# Patient Record
Sex: Female | Born: 1975 | Race: Black or African American | Hispanic: No | Marital: Married | Smoking: Current every day smoker
Health system: Southern US, Community
[De-identification: ages and names within clinical notes are randomized; demographics above are authoritative.]

## PROBLEM LIST (undated history)

## (undated) DIAGNOSIS — IMO0002 Reserved for concepts with insufficient information to code with codable children: Secondary | ICD-10-CM

## (undated) DIAGNOSIS — I10 Essential (primary) hypertension: Secondary | ICD-10-CM

## (undated) DIAGNOSIS — M1990 Unspecified osteoarthritis, unspecified site: Secondary | ICD-10-CM

## (undated) HISTORY — PX: OTHER SURGICAL HISTORY: SHX169

## (undated) HISTORY — DX: Reserved for concepts with insufficient information to code with codable children: IMO0002

---

## 2003-03-27 ENCOUNTER — Emergency Department (HOSPITAL_COMMUNITY): Admission: EM | Admit: 2003-03-27 | Discharge: 2003-03-27 | Payer: Self-pay | Admitting: Emergency Medicine

## 2008-01-20 ENCOUNTER — Emergency Department (HOSPITAL_COMMUNITY): Admission: EM | Admit: 2008-01-20 | Discharge: 2008-01-20 | Payer: Self-pay | Admitting: Emergency Medicine

## 2008-12-24 ENCOUNTER — Emergency Department (HOSPITAL_COMMUNITY): Admission: EM | Admit: 2008-12-24 | Discharge: 2008-12-24 | Payer: Self-pay | Admitting: Emergency Medicine

## 2009-03-25 ENCOUNTER — Other Ambulatory Visit: Admission: RE | Admit: 2009-03-25 | Discharge: 2009-03-25 | Payer: Self-pay | Admitting: Unknown Physician Specialty

## 2009-03-25 ENCOUNTER — Other Ambulatory Visit
Admission: RE | Admit: 2009-03-25 | Discharge: 2009-03-25 | Payer: Self-pay | Source: Home / Self Care | Admitting: Unknown Physician Specialty

## 2010-08-05 LAB — HEPATITIS B SURFACE ANTIGEN: Hepatitis B Surface Ag: NEGATIVE

## 2010-08-05 LAB — ABO/RH: RH Type: POSITIVE

## 2010-08-05 LAB — HIV ANTIBODY (ROUTINE TESTING W REFLEX): HIV: NONREACTIVE

## 2010-08-05 LAB — RPR: RPR: NONREACTIVE

## 2010-08-31 ENCOUNTER — Other Ambulatory Visit (HOSPITAL_COMMUNITY)
Admission: RE | Admit: 2010-08-31 | Discharge: 2010-08-31 | Disposition: A | Discharge status: Home / Self Care | Payer: PRIVATE HEALTH INSURANCE | Source: Ambulatory Visit | Attending: Obstetrics and Gynecology | Admitting: Obstetrics and Gynecology

## 2010-08-31 ENCOUNTER — Other Ambulatory Visit: Payer: Self-pay | Admitting: Obstetrics and Gynecology

## 2010-08-31 DIAGNOSIS — Z01419 Encounter for gynecological examination (general) (routine) without abnormal findings: Secondary | ICD-10-CM | POA: Insufficient documentation

## 2010-11-24 LAB — STREP A DNA PROBE

## 2010-11-24 LAB — RAPID STREP SCREEN (MED CTR MEBANE ONLY): Streptococcus, Group A Screen (Direct): NEGATIVE

## 2011-01-21 ENCOUNTER — Encounter (HOSPITAL_COMMUNITY): Payer: Self-pay | Admitting: Pharmacist

## 2011-01-27 ENCOUNTER — Encounter (HOSPITAL_COMMUNITY): Payer: Self-pay

## 2011-01-28 ENCOUNTER — Encounter (HOSPITAL_COMMUNITY)
Admission: RE | Admit: 2011-01-28 | Discharge: 2011-01-28 | Disposition: A | Discharge status: Home / Self Care | Payer: No Typology Code available for payment source | Source: Ambulatory Visit | Attending: Obstetrics and Gynecology | Admitting: Obstetrics and Gynecology

## 2011-01-28 ENCOUNTER — Other Ambulatory Visit (HOSPITAL_COMMUNITY): Payer: Medicaid Other

## 2011-01-28 ENCOUNTER — Other Ambulatory Visit: Payer: Self-pay | Admitting: Obstetrics and Gynecology

## 2011-01-28 ENCOUNTER — Encounter (HOSPITAL_COMMUNITY): Payer: Self-pay

## 2011-01-28 ENCOUNTER — Encounter: Payer: Self-pay | Admitting: Obstetrics and Gynecology

## 2011-01-28 HISTORY — DX: Unspecified osteoarthritis, unspecified site: M19.90

## 2011-01-28 LAB — SURGICAL PCR SCREEN
MRSA, PCR: NEGATIVE
Staphylococcus aureus: NEGATIVE

## 2011-01-28 LAB — CBC
HCT: 35.4 % — ABNORMAL LOW (ref 36.0–46.0)
Hemoglobin: 11.8 g/dL — ABNORMAL LOW (ref 12.0–15.0)
MCV: 87.2 fL (ref 78.0–100.0)
RDW: 14.6 % (ref 11.5–15.5)
WBC: 9.1 10*3/uL (ref 4.0–10.5)

## 2011-01-28 NOTE — H&P (Signed)
  Gloria Fletcher is a 35 y.o. female presenting for repeat cesarean section and tubal ligation and 39 weeks 2 days gestation after pregnancy course followed at [facility] since 08/05/2010, [redacted] weeks gestation. Prenatal course has been notable for weight gain appropriate fundal height growth with prenatal labs included blood type A positive antibody screen negative, rubella immunity present hemoglobin 13 hematocrit 38, platelets 314,000 with test negative for hepatitis HIV syphilis gonorrhea and chlamydia. GROUP B strep is positive as on 01/21/2011 with 2 hour glucose tolerance test normal. 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  She desires permanent sterilization by tubal ligation at time of cesarean section and understands the failure rate of 1% History OB History    Grav Para Term Preterm Abortions TAB SAB Ect Mult Living   2 1 1       1     Past Medical History  Diagnosis Date  . Arthritis     generalized  . Abnormal Pap smear    Past Surgical History  Procedure Date  . Cesarean section 2002   Family History: family history is not on file. Social History:  reports that she has been smoking Cigarettes.  She has a 4.5 pack-year smoking history. She has never used smokeless tobacco. She reports that she does not drink alcohol or use illicit drugs.  ROS she denies difficulty with prior spinal for cesarean section    Last menstrual period 05/24/2010. Exam Physical Exam Physical Examination: General appearance - alert, well appearing, and in no distress, oriented to person, place, and time and overweight Mouth - dental hygiene good and Good oral airway Chest - clear to auscultation, no wheezes, rales or rhonchi, symmetric air entry Heart - normal rate and regular rhythm Abdomen - gravid uterus 39 cm fundal height fetal heart rate tones 44 beats per minute vertex presentation confirmed. prior well-healed  cesarean section scar without hernia Pelvic -  deferred at this time Extremities - peripheral pulses normal, no pedal edema, no clubbing or cyanosis, pedal edema 1+ + Prenatal labs: ABO, Rh: A/Positive/-- (06/13 0000) Antibody: Negative (06/13 0000) Rubella: Immune (06/13 0000) RPR: Nonreactive (06/13 0000)  HBsAg: Negative (06/13 0000)  HIV: Non-reactive (06/13 0000)  GBS:    positive  Tubal ligation papers signed 11/11/2010  Assessment/Plan: Gloria Fletcher is a 39 weeks 2 days gestation patient scheduled for repeat cesarean section and tubal ligation 9 AM 02/01/2011   Gloria Fletcher V 01/28/2011, 10:10 PM     

## 2011-01-28 NOTE — Patient Instructions (Addendum)
   Your procedure is scheduled on: Monday December 10th  Enter through the Main Entrance of [facility] at: 7:30am Pick up the phone at the desk and dial [phone] and inform us of your arrival.  Please call this number if you have any problems the morning of surgery: [phone]  Remember: Do not eat food after midnight: Sunday Do not drink clear liquids after:midnight Sunday Take these medicines the morning of surgery with a SIP OF WATER: none  Do not wear jewelry, make-up, or FINGER nail polish Do not wear lotions, powders, perfumes or deodorant. Do not shave 48 hours prior to surgery. Do not bring valuables to the hospital.  Leave suitcase in the car. After Surgery it may be brought to your room. For patients being admitted to the hospital, checkout time is 11:00am the day of discharge.     Remember to use your hibiclens as instructed.Please shower with 1/2 bottle the evening before your surgery and the other 1/2 bottle the morning of surgery.

## 2011-01-29 ENCOUNTER — Other Ambulatory Visit (HOSPITAL_COMMUNITY): Payer: Medicaid Other

## 2011-02-01 ENCOUNTER — Encounter (HOSPITAL_COMMUNITY): Payer: Self-pay | Admitting: Anesthesiology

## 2011-02-01 ENCOUNTER — Inpatient Hospital Stay (HOSPITAL_COMMUNITY)
Admission: RE | Admit: 2011-02-01 | Discharge: 2011-02-03 | DRG: 766 | Disposition: A | Discharge status: Home / Self Care | Payer: No Typology Code available for payment source | Source: Ambulatory Visit | Attending: Obstetrics and Gynecology | Admitting: Obstetrics and Gynecology

## 2011-02-01 ENCOUNTER — Inpatient Hospital Stay (HOSPITAL_COMMUNITY): Payer: No Typology Code available for payment source | Admitting: Anesthesiology

## 2011-02-01 ENCOUNTER — Encounter (HOSPITAL_COMMUNITY): Payer: Self-pay | Admitting: *Deleted

## 2011-02-01 ENCOUNTER — Encounter (HOSPITAL_COMMUNITY)
Admission: RE | Disposition: A | Discharge status: Home / Self Care | Payer: Self-pay | Source: Ambulatory Visit | Attending: Obstetrics and Gynecology

## 2011-02-01 DIAGNOSIS — O99892 Other specified diseases and conditions complicating childbirth: Secondary | ICD-10-CM | POA: Diagnosis present

## 2011-02-01 DIAGNOSIS — O34219 Maternal care for unspecified type scar from previous cesarean delivery: Principal | ICD-10-CM | POA: Diagnosis present

## 2011-02-01 DIAGNOSIS — Z01812 Encounter for preprocedural laboratory examination: Secondary | ICD-10-CM

## 2011-02-01 DIAGNOSIS — Z01818 Encounter for other preprocedural examination: Secondary | ICD-10-CM

## 2011-02-01 DIAGNOSIS — Z302 Encounter for sterilization: Secondary | ICD-10-CM

## 2011-02-01 DIAGNOSIS — Z22330 Carrier of Group B streptococcus: Secondary | ICD-10-CM

## 2011-02-01 SURGERY — Surgical Case
Anesthesia: Spinal | Wound class: Clean Contaminated

## 2011-02-01 MED ORDER — MORPHINE SULFATE 0.5 MG/ML IJ SOLN
INTRAMUSCULAR | Status: AC
  Filled 2011-02-01: qty 10

## 2011-02-01 MED ORDER — OXYTOCIN 10 UNIT/ML IJ SOLN
INTRAMUSCULAR | Status: AC
  Filled 2011-02-01: qty 2

## 2011-02-01 MED ORDER — FENTANYL CITRATE 0.05 MG/ML IJ SOLN
INTRAMUSCULAR | Status: DC | PRN
  Administered 2011-02-01: 25 ug via INTRATHECAL

## 2011-02-01 MED ORDER — KETOROLAC TROMETHAMINE 30 MG/ML IJ SOLN: 30.0000 mg | Freq: Four times a day (QID) | INTRAMUSCULAR | Status: AC | PRN

## 2011-02-01 MED ORDER — SIMETHICONE 80 MG PO CHEW: 80.0000 mg | CHEWABLE_TABLET | ORAL | Status: DC | PRN

## 2011-02-01 MED ORDER — ONDANSETRON HCL 4 MG PO TABS: 4.0000 mg | ORAL_TABLET | ORAL | Status: DC | PRN

## 2011-02-01 MED ORDER — IBUPROFEN 600 MG PO TABS
600.0000 mg | ORAL_TABLET | Freq: Four times a day (QID) | ORAL | Status: DC
  Administered 2011-02-01 – 2011-02-03 (×7): 600 mg via ORAL
  Filled 2011-02-01 (×4): qty 1

## 2011-02-01 MED ORDER — OXYCODONE-ACETAMINOPHEN 5-325 MG PO TABS
1.0000 | ORAL_TABLET | ORAL | Status: DC | PRN
  Administered 2011-02-01 – 2011-02-03 (×4): 1 via ORAL
  Administered 2011-02-03: 2 via ORAL
  Filled 2011-02-01 (×3): qty 1
  Filled 2011-02-01: qty 2
  Filled 2011-02-01: qty 1

## 2011-02-01 MED ORDER — DIPHENHYDRAMINE HCL 50 MG/ML IJ SOLN: 12.5000 mg | INTRAMUSCULAR | Status: DC | PRN

## 2011-02-01 MED ORDER — KETOROLAC TROMETHAMINE 30 MG/ML IJ SOLN
30.0000 mg | Freq: Four times a day (QID) | INTRAMUSCULAR | Status: AC | PRN
  Administered 2011-02-01: 30 mg via INTRAVENOUS

## 2011-02-01 MED ORDER — NALBUPHINE HCL 10 MG/ML IJ SOLN
5.0000 mg | INTRAMUSCULAR | Status: DC | PRN
  Filled 2011-02-01: qty 1

## 2011-02-01 MED ORDER — METOCLOPRAMIDE HCL 5 MG/ML IJ SOLN: 10.0000 mg | Freq: Once | INTRAMUSCULAR | Status: DC | PRN

## 2011-02-01 MED ORDER — EPHEDRINE SULFATE 50 MG/ML IJ SOLN
INTRAMUSCULAR | Status: DC | PRN
  Administered 2011-02-01 (×4): 10 mg via INTRAVENOUS

## 2011-02-01 MED ORDER — MENTHOL 3 MG MT LOZG: 1.0000 | LOZENGE | OROMUCOSAL | Status: DC | PRN

## 2011-02-01 MED ORDER — SENNOSIDES-DOCUSATE SODIUM 8.6-50 MG PO TABS
2.0000 | ORAL_TABLET | Freq: Every day | ORAL | Status: DC
  Administered 2011-02-01: 2 via ORAL

## 2011-02-01 MED ORDER — NALOXONE HCL 0.4 MG/ML IJ SOLN: 0.4000 mg | INTRAMUSCULAR | Status: DC | PRN

## 2011-02-01 MED ORDER — ONDANSETRON HCL 4 MG/2ML IJ SOLN: 4.0000 mg | INTRAMUSCULAR | Status: DC | PRN

## 2011-02-01 MED ORDER — TETANUS-DIPHTH-ACELL PERTUSSIS 5-2.5-18.5 LF-MCG/0.5 IM SUSP
0.5000 mL | Freq: Once | INTRAMUSCULAR | Status: AC
  Administered 2011-02-02: 0.5 mL via INTRAMUSCULAR
  Filled 2011-02-01: qty 0.5

## 2011-02-01 MED ORDER — CEFAZOLIN SODIUM-DEXTROSE 2-3 GM-% IV SOLR
2.0000 g | INTRAVENOUS | Status: AC
  Administered 2011-02-01: 2 g via INTRAVENOUS
  Filled 2011-02-01: qty 50

## 2011-02-01 MED ORDER — METOCLOPRAMIDE HCL 5 MG/ML IJ SOLN: 10.0000 mg | Freq: Three times a day (TID) | INTRAMUSCULAR | Status: DC | PRN

## 2011-02-01 MED ORDER — SCOPOLAMINE 1 MG/3DAYS TD PT72
MEDICATED_PATCH | TRANSDERMAL | Status: AC
  Administered 2011-02-01: 1.5 mg via TRANSDERMAL
  Filled 2011-02-01: qty 1

## 2011-02-01 MED ORDER — PHENYLEPHRINE HCL 10 MG/ML IJ SOLN
INTRAMUSCULAR | Status: DC | PRN
  Administered 2011-02-01: 80 ug via INTRAVENOUS

## 2011-02-01 MED ORDER — WITCH HAZEL-GLYCERIN EX PADS: 1.0000 "application " | MEDICATED_PAD | CUTANEOUS | Status: DC | PRN

## 2011-02-01 MED ORDER — FENTANYL CITRATE 0.05 MG/ML IJ SOLN
INTRAMUSCULAR | Status: AC
  Filled 2011-02-01: qty 2

## 2011-02-01 MED ORDER — OXYTOCIN 20 UNITS IN LACTATED RINGERS INFUSION - SIMPLE
125.0000 mL/h | INTRAVENOUS | Status: AC
  Administered 2011-02-01: 125 mL/h via INTRAVENOUS

## 2011-02-01 MED ORDER — SODIUM CHLORIDE 0.9 % IV SOLN: 1.0000 ug/kg/h | INTRAVENOUS | Status: DC | PRN

## 2011-02-01 MED ORDER — SIMETHICONE 80 MG PO CHEW
80.0000 mg | CHEWABLE_TABLET | Freq: Three times a day (TID) | ORAL | Status: DC
  Administered 2011-02-01 – 2011-02-03 (×6): 80 mg via ORAL

## 2011-02-01 MED ORDER — EPHEDRINE 5 MG/ML INJ
INTRAVENOUS | Status: AC
  Filled 2011-02-01: qty 10

## 2011-02-01 MED ORDER — DIPHENHYDRAMINE HCL 25 MG PO CAPS: 25.0000 mg | ORAL_CAPSULE | ORAL | Status: DC | PRN

## 2011-02-01 MED ORDER — ONDANSETRON HCL 4 MG/2ML IJ SOLN: 4.0000 mg | Freq: Three times a day (TID) | INTRAMUSCULAR | Status: DC | PRN

## 2011-02-01 MED ORDER — DIPHENHYDRAMINE HCL 50 MG/ML IJ SOLN: 25.0000 mg | INTRAMUSCULAR | Status: DC | PRN

## 2011-02-01 MED ORDER — BUPIVACAINE IN DEXTROSE 0.75-8.25 % IT SOLN
INTRATHECAL | Status: DC | PRN
  Administered 2011-02-01: 11 mg via INTRATHECAL

## 2011-02-01 MED ORDER — SCOPOLAMINE 1 MG/3DAYS TD PT72
1.0000 | MEDICATED_PATCH | Freq: Once | TRANSDERMAL | Status: DC
  Administered 2011-02-01: 1.5 mg via TRANSDERMAL

## 2011-02-01 MED ORDER — ONDANSETRON HCL 4 MG/2ML IJ SOLN
INTRAMUSCULAR | Status: AC
  Filled 2011-02-01: qty 2

## 2011-02-01 MED ORDER — SODIUM CHLORIDE 0.9 % IJ SOLN: 3.0000 mL | INTRAMUSCULAR | Status: DC | PRN

## 2011-02-01 MED ORDER — LACTATED RINGERS IV SOLN
INTRAVENOUS | Status: DC | PRN
  Administered 2011-02-01 (×4): via INTRAVENOUS

## 2011-02-01 MED ORDER — OXYTOCIN 20 UNITS IN LACTATED RINGERS INFUSION - SIMPLE
INTRAVENOUS | Status: DC | PRN
  Administered 2011-02-01 (×2): 20 [IU] via INTRAVENOUS

## 2011-02-01 MED ORDER — MEPERIDINE HCL 25 MG/ML IJ SOLN: 6.2500 mg | INTRAMUSCULAR | Status: DC | PRN

## 2011-02-01 MED ORDER — ZOLPIDEM TARTRATE 5 MG PO TABS: 5.0000 mg | ORAL_TABLET | Freq: Every evening | ORAL | Status: DC | PRN

## 2011-02-01 MED ORDER — MORPHINE SULFATE (PF) 0.5 MG/ML IJ SOLN
INTRAMUSCULAR | Status: DC | PRN
  Administered 2011-02-01: .15 mg via INTRATHECAL

## 2011-02-01 MED ORDER — KETOROLAC TROMETHAMINE 30 MG/ML IJ SOLN
INTRAMUSCULAR | Status: AC
  Administered 2011-02-01: 30 mg via INTRAVENOUS
  Filled 2011-02-01: qty 1

## 2011-02-01 MED ORDER — FENTANYL CITRATE 0.05 MG/ML IJ SOLN: 25.0000 ug | INTRAMUSCULAR | Status: DC | PRN

## 2011-02-01 MED ORDER — LANOLIN HYDROUS EX OINT: 1.0000 "application " | TOPICAL_OINTMENT | CUTANEOUS | Status: DC | PRN

## 2011-02-01 MED ORDER — IBUPROFEN 600 MG PO TABS
600.0000 mg | ORAL_TABLET | Freq: Four times a day (QID) | ORAL | Status: DC | PRN
  Filled 2011-02-01 (×3): qty 1

## 2011-02-01 MED ORDER — PHENYLEPHRINE 40 MCG/ML (10ML) SYRINGE FOR IV PUSH (FOR BLOOD PRESSURE SUPPORT)
PREFILLED_SYRINGE | INTRAVENOUS | Status: AC
  Filled 2011-02-01: qty 5

## 2011-02-01 MED ORDER — DIBUCAINE 1 % RE OINT: 1.0000 "application " | TOPICAL_OINTMENT | RECTAL | Status: DC | PRN

## 2011-02-01 MED ORDER — PRENATAL PLUS 27-1 MG PO TABS
1.0000 | ORAL_TABLET | Freq: Every day | ORAL | Status: DC
  Administered 2011-02-01 – 2011-02-03 (×3): 1 via ORAL
  Filled 2011-02-01 (×3): qty 1

## 2011-02-01 MED ORDER — ONDANSETRON HCL 4 MG/2ML IJ SOLN
INTRAMUSCULAR | Status: DC | PRN
  Administered 2011-02-01: 4 mg via INTRAVENOUS

## 2011-02-01 MED ORDER — LACTATED RINGERS IV SOLN
INTRAVENOUS | Status: DC
  Administered 2011-02-01: 16:00:00 via INTRAVENOUS

## 2011-02-01 MED ORDER — SCOPOLAMINE 1 MG/3DAYS TD PT72: 1.0000 | MEDICATED_PATCH | Freq: Once | TRANSDERMAL | Status: DC

## 2011-02-01 MED ORDER — DIPHENHYDRAMINE HCL 25 MG PO CAPS: 25.0000 mg | ORAL_CAPSULE | Freq: Four times a day (QID) | ORAL | Status: DC | PRN

## 2011-02-01 MED ORDER — LACTATED RINGERS IV SOLN
INTRAVENOUS | Status: DC
  Administered 2011-02-01: 09:00:00 via INTRAVENOUS

## 2011-02-01 SURGICAL SUPPLY — 27 items
BENZOIN TINCTURE PRP APPL 2/3 (GAUZE/BANDAGES/DRESSINGS) IMPLANT
CLIP FILSHIE TUBAL LIGA STRL (Clip) ×2 IMPLANT
CLOTH BEACON ORANGE TIMEOUT ST (SAFETY) ×2 IMPLANT
ELECT REM PT RETURN 9FT ADLT (ELECTROSURGICAL) ×2
ELECTRODE REM PT RTRN 9FT ADLT (ELECTROSURGICAL) ×1 IMPLANT
EXTRACTOR VACUUM KIWI (MISCELLANEOUS) IMPLANT
GLOVE BIO SURGEON ST LM GN SZ9 (GLOVE) ×2 IMPLANT
GLOVE BIOGEL PI IND STRL 9 (GLOVE) ×2 IMPLANT
GLOVE BIOGEL PI INDICATOR 9 (GLOVE) ×2
GOWN PREVENTION PLUS LG XLONG (DISPOSABLE) ×2 IMPLANT
GOWN PREVENTION PLUS XLARGE (GOWN DISPOSABLE) ×2 IMPLANT
GOWN STRL REIN 3XL LVL4 (GOWN DISPOSABLE) ×2 IMPLANT
NEEDLE HYPO 25X5/8 SAFETYGLIDE (NEEDLE) IMPLANT
NS IRRIG 1000ML POUR BTL (IV SOLUTION) ×2 IMPLANT
PACK C SECTION WH (CUSTOM PROCEDURE TRAY) ×2 IMPLANT
RTRCTR C-SECT PINK 25CM LRG (MISCELLANEOUS) ×2 IMPLANT
SLEEVE SCD COMPRESS KNEE MED (MISCELLANEOUS) IMPLANT
STRIP CLOSURE SKIN 1/2X4 (GAUZE/BANDAGES/DRESSINGS) IMPLANT
SUT CHROMIC 0 CTX 36 (SUTURE) ×4 IMPLANT
SUT VIC AB 0 CT1 27 (SUTURE) ×1
SUT VIC AB 0 CT1 27XBRD ANBCTR (SUTURE) ×1 IMPLANT
SUT VIC AB 2-0 CT1 27 (SUTURE) ×2
SUT VIC AB 2-0 CT1 TAPERPNT 27 (SUTURE) ×2 IMPLANT
SUT VIC AB 4-0 KS 27 (SUTURE) ×2 IMPLANT
SYR BULB IRRIGATION 50ML (SYRINGE) IMPLANT
TRAY FOLEY CATH 14FR (SET/KITS/TRAYS/PACK) ×2 IMPLANT
WATER STERILE IRR 1000ML POUR (IV SOLUTION) ×2 IMPLANT

## 2011-02-01 NOTE — Brief Op Note (Signed)
02/01/2011  10:18 AM  PATIENT:  Gloria Fletcher  35 y.o. female  PRE-OPERATIVE DIAGNOSIS:  Previous C-section; Desires Sterilization   POST-OPERATIVE DIAGNOSIS:  Previous C-section; Desires Sterilization  PROCEDURE:  Procedure(s): CESAREAN SECTION WITH BILATERAL TUBAL LIGATION  SURGEON:  Surgeon(s): Tilda Burrow, MD  PHYSICIAN ASSISTANT: none  ASSISTANTS: none   ANESTHESIA:   spinal  EBL:  Total I/O In: 3000 [I.V.:3000] Out: 1150 [Urine:150; Blood:1000]  BLOOD ADMINISTERED:none  DRAINS: Urinary Catheter (Foley)   LOCAL MEDICATIONS USED:  NONE  SPECIMEN:  Source of Specimen:  placenta  DISPOSITION OF SPECIMEN:  labor and delivery  COUNTS:  YES  TOURNIQUET:  * No tourniquets in log *  DICTATION: .Dragon Dictation  PLAN OF CARE: Admit to inpatient   PATIENT DISPOSITION:  PACU - hemodynamically stable.   Delay start of Pharmacological VTE agent (>24hrs) due to surgical blood loss or risk of bleeding:  {YES/NO/NOT APPLICABLE:20182

## 2011-02-01 NOTE — Anesthesia Postprocedure Evaluation (Signed)
  Anesthesia Post-op Note  Patient: Gloria Fletcher  Procedure(s) Performed:  CESAREAN SECTION WITH BILATERAL TUBAL LIGATION - Repeat Cesarian Section with Bilateral Tubal Ligatins with Filshie Clips  Patient is awake, responsive, moving her legs, and has signs of resolution of her numbness. Pain and nausea are reasonably well controlled. Vital signs are stable and clinically acceptable. Oxygen saturation is clinically acceptable. There are no apparent anesthetic complications at this time. Patient is ready for discharge.

## 2011-02-01 NOTE — Anesthesia Preprocedure Evaluation (Signed)
Anesthesia Evaluation  Patient identified by MRN, date of birth, ID band Patient awake    Reviewed: Allergy & Precautions, H&P , NPO status , Patient's Chart, lab work & pertinent test results  History of Anesthesia Complications (+) PONV  Airway Mallampati: III TM Distance: >3 FB Neck ROM: Full    Dental No notable dental hx. (+) Teeth Intact   Pulmonary neg pulmonary ROS,  clear to auscultation  Pulmonary exam normal       Cardiovascular neg cardio ROS Regular Normal    Neuro/Psych Negative Neurological ROS  Negative Psych ROS   GI/Hepatic negative GI ROS, Neg liver ROS,   Endo/Other  Negative Endocrine ROSMorbid obesity  Renal/GU negative Renal ROS  Genitourinary negative   Musculoskeletal   Abdominal   Peds  Hematology negative hematology ROS (+)   Anesthesia Other Findings   Reproductive/Obstetrics (+) Pregnancy                           Anesthesia Physical Anesthesia Plan  ASA: III  Anesthesia Plan: Spinal   Post-op Pain Management:    Induction:   Airway Management Planned:   Additional Equipment:   Intra-op Plan:   Post-operative Plan:   Informed Consent: I have reviewed the patients History and Physical, chart, labs and discussed the procedure including the risks, benefits and alternatives for the proposed anesthesia with the patient or authorized representative who has indicated his/her understanding and acceptance.   Dental Advisory Given  Plan Discussed with: Anesthesiologist, CRNA and Surgeon  Anesthesia Plan Comments:         Anesthesia Quick Evaluation

## 2011-02-01 NOTE — Interval H&P Note (Signed)
History and Physical Interval Note:  02/01/2011 8:55 AM  Gloria Fletcher  has presented today for surgery, with the diagnosis of Previous C-section; Desires Sterilization   The various methods of treatment have been discussed with the patient and family. After consideration of risks, benefits and other options for treatment, the patient has consented to  Procedure(s): CESAREAN SECTION WITH BILATERAL TUBAL LIGATION as a surgical intervention .  The patients' history has been reviewed, patient examined, no change in status, stable for surgery.  I have reviewed the patients' chart and labs.  Questions were answered to the patient's satisfaction.     Makail Watling V  I have interviewed the patient, examined the abdomen, confirmed fetal movement this morning and a Fht documentation by RN. No changes in surgical plan, or patient condition since the above documented surgical history

## 2011-02-01 NOTE — Anesthesia Procedure Notes (Signed)
Spinal  Patient location during procedure: OR Start time: 02/01/2011 9:26 AM Staffing Anesthesiologist: Trevan Messman A. Preanesthetic Checklist Completed: patient identified, site marked, surgical consent, pre-op evaluation, timeout performed, IV checked, risks and benefits discussed and monitors and equipment checked Spinal Block Patient position: sitting Prep: site prepped and draped and DuraPrep Patient monitoring: heart rate, cardiac monitor, continuous pulse ox and blood pressure Approach: midline Location: L3-4 Injection technique: single-shot Needle Needle type: Sprotte  Needle gauge: 24 G Needle length: 9 cm Needle insertion depth: 6 cm Assessment Sensory level: T4 Additional Notes Patient tolerated procedure well. Adequate sensory level.

## 2011-02-01 NOTE — H&P (View-Only) (Signed)
  Gloria Fletcher is a 35 y.o. female presenting for repeat cesarean section and tubal ligation and 39 weeks 2 days gestation after pregnancy course followed at [facility] since 08/05/2010, [redacted] weeks gestation. Prenatal course has been notable for weight gain appropriate fundal height growth with prenatal labs included blood type A positive antibody screen negative, rubella immunity present hemoglobin 13 hematocrit 38, platelets 314,000 with test negative for hepatitis HIV syphilis gonorrhea and chlamydia. GROUP B strep is positive as on 01/21/2011 with 2 hour glucose tolerance test normal. 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  She desires permanent sterilization by tubal ligation at time of cesarean section and understands the failure rate of 1% History OB History    Grav Para Term Preterm Abortions TAB SAB Ect Mult Living   2 1 1       1      Past Medical History  Diagnosis Date  . Arthritis     generalized  . Abnormal Pap smear    Past Surgical History  Procedure Date  . Cesarean section 2002   Family History: family history is not on file. Social History:  reports that she has been smoking Cigarettes.  She has a 4.5 pack-year smoking history. She has never used smokeless tobacco. She reports that she does not drink alcohol or use illicit drugs.  ROS she denies difficulty with prior spinal for cesarean section    Last menstrual period 05/24/2010. Exam Physical Exam Physical Examination: General appearance - alert, well appearing, and in no distress, oriented to person, place, and time and overweight Mouth - dental hygiene good and Good oral airway Chest - clear to auscultation, no wheezes, rales or rhonchi, symmetric air entry Heart - normal rate and regular rhythm Abdomen - gravid uterus 39 cm fundal height fetal heart rate tones 44 beats per minute vertex presentation confirmed. prior well-healed  cesarean section scar without hernia Pelvic -  deferred at this time Extremities - peripheral pulses normal, no pedal edema, no clubbing or cyanosis, pedal edema 1+ + Prenatal labs: ABO, Rh: A/Positive/-- (06/13 0000) Antibody: Negative (06/13 0000) Rubella: Immune (06/13 0000) RPR: Nonreactive (06/13 0000)  HBsAg: Negative (06/13 0000)  HIV: Non-reactive (06/13 0000)  GBS:    positive  Tubal ligation papers signed 11/11/2010  Assessment/Plan: Gloria Fletcher is a 39 weeks 2 days gestation patient scheduled for repeat cesarean section and tubal ligation 9 AM 02/01/2011   Gloria Fletcher V 01/28/2011, 10:10 PM

## 2011-02-01 NOTE — Progress Notes (Signed)
UR chart review completed.  

## 2011-02-01 NOTE — Op Note (Signed)
10/16/2010  9:29 AM  PATIENT:  Gloria Fletcher  PRE-OPERATIVE DIAGNOSIS: Pregnancy [redacted] weeks gestation prior cesarean section not desiring trial of labor, desire for permanent sterilization  POST-OPERATIVE DIAGNOSIS:  Same  PROCEDURE:  Procedure(s): Repeat low transverse cervical cesarean section  SURGEON:Calen Geister Benancio Deeds, MD  PHYSICIAN ASSISTANT: None  ADDITIONAL ASSISTANTS: none   ANESTHESIA:   spinal  ESTIMATED BLOOD LOSS: 1000 cc   BLOOD ADMINISTERED:none  DRAINS: Urinary Catheter (Foley)   LOCAL MEDICATIONS USED:  NONE  SPECIMEN:  Source of Specimen:  Placenta  DISPOSITION OF SPECIMEN:  Labor and delivery  COUNTS:  YES  TOURNIQUET:  * No tourniquets in log *  DICTATION #: Patient taken to the operating room prepped and draped for lower surgery after spinal anesthesia introduced. With more the 1 effort made to identify the subarachnoid space. Abdomen prepped and draped with Foley catheter in place timeout conducted. Transverse lower incision repeated at the previous scar in standard method of Pfannenstiel with transverse opening of the fascia midline. Entry into the peritoneal cavity easily accomplished, there were minimal adhesions identified, some thin omental adhesions to the upper aspects of the midline incision. Transverse uterine incision was then performed, and fundal pressure used to guide the vertex through the incision. Alexis wound protractor was in place during the procedure. Cord was clamped and infant passed to the pediatrician . See their notes for details regarding the baby. Was senna delivered easily in response to IV oxytocin and uterine massage. Excess bleeding from the left uterine corner was corrected with a figure-of-eight suture, then the entire incision closed in a running locking 0 chromic suture. Second layer closure was continuous running 0 chromic.  Tubal ligation: Tubal ligation consisted of placement of the Filshie clip on the  midportion of each tube with good visualization throughout the procedure.  Anterior peritoneum was closed with running 00 Vicryl, the fascia closed with 0 Vicryl subcutaneous tissues closed with 2-0 Vicryl, then running 4-0 Vicryl on a Keith needle used to close the skin edges. Steri-Strips were applied with benzoin. Sponge and needle counts were correct condition to recovery in good  PLAN OF CARE: Inpatient admission  PATIENT DISPOSITION:  PACU - hemodynamically stable.   Delay start of Pharmacological VTE agent (>24hrs) due to surgical blood loss or risk of bleeding:  not applicable

## 2011-02-01 NOTE — Transfer of Care (Signed)
Immediate Anesthesia Transfer of Care Note  Patient: Gloria Fletcher  Procedure(s) Performed:  CESAREAN SECTION WITH BILATERAL TUBAL LIGATION - Repeat Cesarian Section with Bilateral Tubal Ligatins with Filshie Clips  Patient Location: PACU  Anesthesia Type: Spinal  Level of Consciousness: awake, alert  and oriented  Airway & Oxygen Therapy: Patient Spontanous Breathing  Post-op Assessment: Report given to PACU RN and Post -op Vital signs reviewed and stable  Post vital signs: Reviewed and stable  Complications: No apparent anesthesia complications

## 2011-02-02 LAB — CBC
HCT: 31.2 % — ABNORMAL LOW (ref 36.0–46.0)
MCHC: 33.3 g/dL (ref 30.0–36.0)
Platelets: 293 10*3/uL (ref 150–400)
RDW: 15 % (ref 11.5–15.5)

## 2011-02-02 MED ORDER — SENNOSIDES-DOCUSATE SODIUM 8.6-50 MG PO TABS
2.0000 | ORAL_TABLET | Freq: Every day | ORAL | Status: DC | PRN
  Administered 2011-02-02 (×2): 2 via ORAL

## 2011-02-02 NOTE — Anesthesia Postprocedure Evaluation (Signed)
  Anesthesia Post-op Note  Patient: Gloria Fletcher  Procedure(s) Performed:  CESAREAN SECTION WITH BILATERAL TUBAL LIGATION - Repeat Cesarian Section with Bilateral Tubal Ligatins with Filshie Clips  Patient Location: Mother/Baby  Anesthesia Type: Spinal  Level of Consciousness: awake, alert  and oriented  Airway and Oxygen Therapy: spontaneous breathing  Post-op Pain: none  Post-op Assessment: Post-op Vital signs reviewed, Patient's Cardiovascular Status Stable, PATIENT'S CARDIOVASCULAR STATUS UNSTABLE, No headache, No backache, No residual numbness and No residual motor weakness  Post-op Vital Signs: Reviewed and stable  Complications: No apparent anesthesia complications

## 2011-02-02 NOTE — Addendum Note (Signed)
Addendum  created 02/02/11 0824 by Madison Hickman   Modules edited:Notes Section

## 2011-02-02 NOTE — Progress Notes (Signed)
Post Operative Day 1 Subjective: no complaints, up ad lib, voiding, tolerating PO and + flatus  Objective: Blood pressure 97/60, pulse 68, temperature 98.3 F (36.8 C), temperature source Oral, resp. rate 20, weight 96.616 kg (213 lb), last menstrual period 05/24/2010, SpO2 97.00%, unknown if currently breastfeeding.  Physical Exam:  General: alert, cooperative and no distress Card: RRR Resp: CTAB Lochia: appropriate Uterine Fundus: firm Incision: c/d/i DVT Evaluation: No cords or calf tenderness. No significant calf/ankle edema.   Basename 02/02/11 0510  HGB 10.4*  HCT 31.2*    Assessment/Plan: Contraception BTL, breast and bottle feeding.  likely home POD#3. Senna for constipation.   LOS: 1 day   Levada Schilling 02/02/2011, 7:28 AM

## 2011-02-03 MED ORDER — IBUPROFEN 600 MG PO TABS: 600.0000 mg | ORAL_TABLET | Freq: Four times a day (QID) | ORAL | Status: AC | PRN

## 2011-02-03 MED ORDER — OXYCODONE-ACETAMINOPHEN 5-325 MG PO TABS: 1.0000 | ORAL_TABLET | Freq: Four times a day (QID) | ORAL | Status: AC | PRN

## 2011-02-03 MED ORDER — PRENATAL 27-0.8 MG PO TABS: 1.0000 | ORAL_TABLET | Freq: Every day | ORAL | Status: DC

## 2011-02-03 NOTE — Discharge Summary (Signed)
Agree with above note.  Gloria Fletcher 02/03/2011 3:36 PM

## 2011-02-03 NOTE — Discharge Summary (Signed)
Obstetric Discharge Summary Reason for Admission: cesarean section Prenatal Procedures: NST Intrapartum Procedures: cesarean: low cervical, transverse Postpartum Procedures: none Complications-Operative and Postpartum: none Hemoglobin  Date Value Range Status  02/02/2011 10.4* 12.0-15.0 (g/dL) Final     HCT  Date Value Range Status  02/02/2011 31.2* 36.0-46.0 (%) Final    Discharge Diagnoses: Term Pregnancy-delivered Gloria Fletcher 35 y.o. female  Now G2P2002 at [redacted]w[redacted]d presenting for repeat cesarean section and tubal ligation at 39 weeks 2 days gestation. Patient of [facility] since 08/05/2010, [redacted] weeks gestation. Patient had these procedures performed without complication. Patient did well postoperatively and requested discharge after 48 hours from surgery.   Breast/bottle feeding. S/p BTL for contraception. Outpatient circumcision.   Discharge Information: Date: 02/03/2011 Activity: pelvic rest Diet: routine Medications: PNV, Ibuprofen and Percocet Condition: stable Instructions: refer to practice specific booklet Discharge to: home Follow-up Information    Make an appointment with [facility]. (within 4 weeks. )          Newborn Data: Live born female  Birth Weight: 7 lb 7.2 oz (3380 g) APGAR: 9, 9  Home with mother.  Tana Conch, MD 02/03/2011, 8:07 AM

## 2011-02-03 NOTE — Progress Notes (Signed)
Subjective: Postpartum Day 2: Cesarean Delivery Patient reports incisional pain, tolerating PO, + flatus, + BM and no problems voiding.    Objective: Vital signs in last 24 hours: Temp:  [98.2 F (36.8 C)-98.7 F (37.1 C)] 98.2 F (36.8 C) (12/12 0657) Pulse Rate:  [60-76] 76  (12/12 0657) Resp:  [18] 18  (12/12 0657) BP: (102-114)/(60-76) 114/76 mmHg (12/12 0657) SpO2:  [98 %-99 %] 99 % (12/12 0657)  Physical Exam:  General: alert, cooperative and no distress Lochia: appropriate Uterine Fundus: firm Incision: healing well DVT Evaluation: No cords or calf tenderness. Calf/Ankle edema is present 1+ pitting bilaterally   Basename 02/02/11 0510  HGB 10.4*  HCT 31.2*    Assessment/Plan: Status post Cesarean section. Doing well postoperatively.  Continue current care. Discharge today.  S/p BTL.  Breast/bottle.  Interested in Circumcision on outpatient basis.   Tana Conch 02/03/2011, 7:39 AM

## 2011-05-19 ENCOUNTER — Encounter (HOSPITAL_COMMUNITY): Payer: Self-pay | Admitting: *Deleted

## 2011-05-19 ENCOUNTER — Emergency Department (HOSPITAL_COMMUNITY)
Admission: EM | Admit: 2011-05-19 | Discharge: 2011-05-19 | Disposition: A | Discharge status: Home / Self Care | Payer: PRIVATE HEALTH INSURANCE | Attending: Emergency Medicine | Admitting: Emergency Medicine

## 2011-05-19 DIAGNOSIS — F17200 Nicotine dependence, unspecified, uncomplicated: Secondary | ICD-10-CM | POA: Insufficient documentation

## 2011-05-19 DIAGNOSIS — M129 Arthropathy, unspecified: Secondary | ICD-10-CM | POA: Insufficient documentation

## 2011-05-19 DIAGNOSIS — K0889 Other specified disorders of teeth and supporting structures: Secondary | ICD-10-CM

## 2011-05-19 DIAGNOSIS — K029 Dental caries, unspecified: Secondary | ICD-10-CM | POA: Insufficient documentation

## 2011-05-19 MED ORDER — HYDROCODONE-ACETAMINOPHEN 5-325 MG PO TABS: ORAL_TABLET | ORAL | Status: DC

## 2011-05-19 MED ORDER — IBUPROFEN 200 MG PO TABS
400.0000 mg | ORAL_TABLET | Freq: Once | ORAL | Status: AC
  Administered 2011-05-19: 400 mg via ORAL
  Filled 2011-05-19: qty 2

## 2011-05-19 MED ORDER — PENICILLIN V POTASSIUM 250 MG PO TABS: 250.0000 mg | ORAL_TABLET | Freq: Four times a day (QID) | ORAL | Status: AC

## 2011-05-19 NOTE — ED Provider Notes (Signed)
History     CSN: [id number]  Arrival date & time 05/19/11  0907   First MD Initiated Contact with Patient 05/19/11 [phone]      Chief Complaint  Patient presents with  . Dental Pain    radiates to back of head for 2 days    HPI Pt was seen at 0940.  Per pt, c/o gradual onset and persistence of constant left lower tooth "pain" for the past 2 days.  Denies fevers, no intra-oral edema, no rash, no facial swelling, no dysphagia, no neck pain.   The condition is aggravated by nothing. The condition is relieved by nothing. The patient has no significant history of serious medical conditions.    Past Medical History  Diagnosis Date  . Arthritis     generalized  . Abnormal Pap smear     Past Surgical History  Procedure Date  . Cesarean section 2002  . Other surgical history     c-section    History  Substance Use Topics  . Smoking status: Current Everyday Smoker -- 0.2 packs/day for 18 years    Types: Cigarettes  . Smokeless tobacco: Never Used  . Alcohol Use: Not on file    OB History    Grav Para Term Preterm Abortions TAB SAB Ect Mult Living   2 2 2       2       Review of Systems ROS: Statement: All systems negative except as marked or noted in the HPI; Constitutional: Negative for fever and chills. ; ; Eyes: Negative for eye pain and discharge. ; ; ENMT: Positive for dental caries, dental hygiene poor and toothache. Negative for ear pain, bleeding gums, dental injury, facial deformity, facial swelling, hoarseness, nasal congestion, sinus pressure, sore throat, throat swelling and tongue swollen. ; ; Cardiovascular: Negative for chest pain, palpitations, diaphoresis, dyspnea and peripheral edema. ; ; Respiratory: Negative for cough, wheezing and stridor. ; ; Gastrointestinal: Negative for nausea, vomiting, diarrhea and abdominal pain. ; ; Genitourinary: Negative for dysuria, flank pain and hematuria. ; ; Musculoskeletal: Negative for back pain and neck pain. ; ; Skin: Negative  for rash and skin lesion. ; ; Neuro: Negative for headache, lightheadedness and neck stiffness. ;     Allergies  Review of patient's allergies indicates no known allergies.  Home Medications   Current Outpatient Rx  Name Route Sig Dispense Refill  . DIPHENHYDRAMINE-APAP (SLEEP) 25-500 MG PO TABS Oral Take 2 tablets by mouth at bedtime as needed. As needed for sleep    . HYDROCODONE-ACETAMINOPHEN 5-500 MG PO CAPS Oral Take 2 capsules by mouth once as needed. As needed for headache.    . IBUPROFEN 200 MG PO TABS Oral Take 400 mg by mouth every 6 (six) hours as needed. As needed for pain.    Marland Kitchen PRENATAL 27-0.8 MG PO TABS Oral Take 1 tablet by mouth daily. 30 each 5    BP 119/75  Pulse 92  Temp(Src) 98.1 F (36.7 C) (Oral)  Resp 18  SpO2 98%  Breastfeeding? Unknown  Physical Exam 0945: Physical examination: Vital signs and O2 SAT: Reviewed; Constitutional: Well developed, Well nourished, Well hydrated, In no acute distress; Head and Face: Normocephalic, Atraumatic; Eyes: EOMI, PERRL, No scleral icterus; ENMT: Mouth and pharynx normal, Poor dentition, Widespread dental decay, Left TM normal, Right TM normal, Mucous membranes moist, +lower left 2nd molar with extensive dental decay.  No gingival erythema, edema, fluctuance, or drainage.  No hoarse voice, no drooling, no stridor.  ;  Neck: Supple, Full range of motion, No lymphadenopathy; Cardiovascular: Regular rate and rhythm, No murmur, rub, or gallop; Respiratory: Breath sounds clear & equal bilaterally, No rales, rhonchi, wheezes, or rub, Normal respiratory effort/excursion; Chest: Nontender, Movement normal; Extremities: Pulses normal, No tenderness, No edema; Neuro: AA&Ox3, Major CN grossly intact.  No gross focal motor or sensory deficits in extremities.; Skin: Color normal, No rash, No petechiae, Warm, Dry   ED Course  Procedures    MDM  MDM Reviewed: nursing note and vitals            Laray Anger, DO 05/19/11  1946

## 2011-05-19 NOTE — Discharge Instructions (Signed)
RESOURCE GUIDE  Dental Problems  Patients with Medicaid: [facility]                     [facility] [address]                                           [address] Phone:  [phone]                                                  Phone:  [phone]  If unable to pay or uninsured, contact:  [facility] or [facility] to become qualified for the adult dental clinic.  Chronic Pain Problems Contact [facility]  [phone] Patients need to be referred by their primary care doctor.  Insufficient Money for Medicine Contact [employer]:  call "211" or [facility] 271-5999.  No Primary Care Doctor Call [facility]  832-8000 Other agencies that provide inexpensive medical care    [facility]  [phone]    [facility]  832-7272    [facility]  [phone]    [facility]  832-4777    [facility]  [phone]    [facility]  [phone]  Psychological Services [facility]  [phone] [facility]  [phone] [facility]   800 853-5163 (emergency services 641-4993)  Substance Abuse Resources [facility]  336-882-2125 [facility] 336-784-9470 [facility] 336-285-9073 [facility] 336-845-3988 Residential & Outpatient Substance Abuse Program  800-659-3381  Abuse/Neglect [place] Child Abuse Hotline (336) 641-3795 [place] [facility] Hotline 800-378-5315 (After Hours)  Emergency Shelter [facility] (336) 271-5985  [facility] (336) 275-9566 [facility] (704) 372-4663  MRSA Hotline #:   832-7006    [place] [facility]  [facility] [facility]     [employer]                          [facility] [address] [place]                       [address]      [address]                                                  [place]                            [place] Phone:  [phone]                                   Phone:  [phone]                 Phone:  [phone]  [facility] Phone:  [phone]    [place] Child Abuse Hotline (336) 342-1394 (336) 342-3537 (After Hours)    Take the prescriptions as directed.  Call your regular dentist today to schedule a follow up appointment within the next week.  Return to the Emergency Department immediately sooner if worsening.   

## 2011-05-19 NOTE — ED Notes (Signed)
Pt is here with Left lower back molar dental decay with hole in tooth and pt sts that pain is now radiating to the back of her head over the last 2 days

## 2011-05-20 ENCOUNTER — Encounter (HOSPITAL_COMMUNITY): Payer: Self-pay | Admitting: *Deleted

## 2011-05-20 ENCOUNTER — Emergency Department (HOSPITAL_COMMUNITY)
Admission: EM | Admit: 2011-05-20 | Discharge: 2011-05-20 | Disposition: A | Discharge status: Home / Self Care | Payer: No Typology Code available for payment source | Attending: Emergency Medicine | Admitting: Emergency Medicine

## 2011-05-20 DIAGNOSIS — R51 Headache: Secondary | ICD-10-CM | POA: Insufficient documentation

## 2011-05-20 DIAGNOSIS — K029 Dental caries, unspecified: Secondary | ICD-10-CM | POA: Insufficient documentation

## 2011-05-20 DIAGNOSIS — I10 Essential (primary) hypertension: Secondary | ICD-10-CM | POA: Insufficient documentation

## 2011-05-20 DIAGNOSIS — K0889 Other specified disorders of teeth and supporting structures: Secondary | ICD-10-CM

## 2011-05-20 DIAGNOSIS — K089 Disorder of teeth and supporting structures, unspecified: Secondary | ICD-10-CM | POA: Insufficient documentation

## 2011-05-20 MED ORDER — KETOROLAC TROMETHAMINE 30 MG/ML IJ SOLN
60.0000 mg | Freq: Once | INTRAMUSCULAR | Status: AC
  Administered 2011-05-20: 60 mg via INTRAMUSCULAR
  Filled 2011-05-20 (×2): qty 1

## 2011-05-20 MED ORDER — HYDROMORPHONE HCL PF 1 MG/ML IJ SOLN
1.0000 mg | Freq: Once | INTRAMUSCULAR | Status: AC
  Administered 2011-05-20: 1 mg via INTRAMUSCULAR
  Filled 2011-05-20: qty 1

## 2011-05-20 MED ORDER — HYDROCODONE-ACETAMINOPHEN 5-325 MG PO TABS: 1.0000 | ORAL_TABLET | ORAL | Status: AC | PRN

## 2011-05-20 MED ORDER — DIAZEPAM 5 MG PO TABS: 5.0000 mg | ORAL_TABLET | Freq: Three times a day (TID) | ORAL | Status: AC | PRN

## 2011-05-20 NOTE — ED Notes (Addendum)
Pt reports she was seen here yesterday for dental pain, pt reports that she was given rx for pain medication and took that. Reports she is now having a headache to lower occipital region. Reports nausea and vomiting. Alert and oriented x 3. Stroke scale negative.

## 2011-05-20 NOTE — ED Provider Notes (Signed)
History     CSN: [id number]  Arrival date & time 05/20/11  1707   First MD Initiated Contact with Patient 05/20/11 1939      Chief Complaint  Patient presents with  . Dental Pain  . Headache    (Consider location/radiation/quality/duration/timing/severity/associated sxs/prior treatment) HPI Comments: For a left lower molar pain returns today for headache.  Patient states that the pain radiates from her left to the jaw, back into the occiput of her head.  She has been taking hydrocodone and penicillin without relief.  Patient states around noon today.  The headache got worse.  She had an episode of nausea and vomiting x2.  Denies sore throat, difficulty swallowing or breathing.  Denies any change in vision, weakness, or numbness of the extremities, chest pain, shortness of breath, abdominal pain.  States it has "been a while" since she has had a headache this bad, but this is not the worst headache of her life.  She currently denies nausea.    Patient is a 36 y.o. female presenting with tooth pain and headaches. The history is provided by the patient.  Dental PainThe primary symptoms include headaches. Primary symptoms do not include fever, shortness of breath or sore throat.  The headache is not associated with weakness.  Additional symptoms do not include: trouble swallowing.   Headache  Associated symptoms include vomiting. Pertinent negatives include no fever and no shortness of breath.    Past Medical History  Diagnosis Date  . Arthritis     generalized  . Abnormal Pap smear     Past Surgical History  Procedure Date  . Cesarean section 2002  . Other surgical history     c-section    History reviewed. No pertinent family history.  History  Substance Use Topics  . Smoking status: Current Everyday Smoker -- 0.2 packs/day for 18 years    Types: Cigarettes  . Smokeless tobacco: Never Used  . Alcohol Use: Not on file    OB History    Grav Para Term Preterm Abortions  TAB SAB Ect Mult Living   2 2 2       2       Review of Systems  Constitutional: Negative for fever and chills.  HENT: Negative for sore throat and trouble swallowing.   Respiratory: Negative for shortness of breath.   Cardiovascular: Negative for chest pain.  Gastrointestinal: Positive for vomiting. Negative for abdominal pain.  Neurological: Positive for headaches. Negative for syncope, speech difficulty, weakness and numbness.  All other systems reviewed and are negative.    Allergies  Review of patient's allergies indicates no known allergies.  Home Medications   Current Outpatient Rx  Name Route Sig Dispense Refill  . DIPHENHYDRAMINE-APAP (SLEEP) 25-500 MG PO TABS Oral Take 2 tablets by mouth at bedtime as needed. As needed for sleep    . HYDROCODONE-ACETAMINOPHEN 5-325 MG PO TABS Oral Take 1-2 tablets by mouth every 4 (four) hours as needed. For pain    . IBUPROFEN 200 MG PO TABS Oral Take 400 mg by mouth every 6 (six) hours as needed. As needed for pain.    Marland Kitchen PENICILLIN V POTASSIUM 250 MG PO TABS Oral Take 1 tablet (250 mg total) by mouth 4 (four) times daily. 20 tablet 0  . PRENATAL 27-0.8 MG PO TABS Oral Take 1 tablet by mouth daily. 30 each 5    BP 171/85  Pulse 75  Temp(Src) 98.3 F (36.8 C) (Oral)  Resp 18  SpO2 100%  Breastfeeding? Unknown  Physical Exam  Nursing note and vitals reviewed. Constitutional: She is oriented to person, place, and time. She appears well-developed and well-nourished.  HENT:  Head: Normocephalic and atraumatic.  Mouth/Throat:    Neck: Neck supple.  Cardiovascular: Normal rate, regular rhythm and normal heart sounds.   Pulmonary/Chest: Breath sounds normal. No respiratory distress. She has no wheezes. She has no rales. She exhibits no tenderness.  Neurological: She is alert and oriented to person, place, and time. She has normal strength. No cranial nerve deficit or sensory deficit. She exhibits normal muscle tone. Coordination  normal. GCS eye subscore is 4. GCS verbal subscore is 5. GCS motor subscore is 6.       CN II-XII intact, EOMs intact, no pronator drift, grip strengths equal bilaterally; finger to nose, heel to shin, rapid alternating movements are normal; strength 5/5 in all extremities, sensation is intact, gait is normal.     Psychiatric: She has a normal mood and affect. Her behavior is normal. Judgment and thought content normal.    ED Course  Procedures (including critical care time)  Labs Reviewed - No data to display No results found.  8:44 PM Patient reports headaches is improving.  Patient appears to have great relief at this time.  Plan is for d/c home.    1. Pain, dental   2. Headache   3. Hypertension       MDM  Patient with dental pain and headache.  Patient seen in ED yesterday for similar symptoms, not uncontrolled on prescribed pain medication.  Headache was not acute in onset, is not the worst of her life, does not have any neurological features, improved with medications in ED.  Pt to return for worsening condition.  Patient verbalizes understanding and agrees with plan.         Dillard Cannon [place], [place] 05/20/11 2133

## 2011-05-20 NOTE — Discharge Instructions (Signed)
Please take your medication as prescribed.  Follow up with your dentist on Wednesday as planned.  At some time in the next 1-2 weeks when your pain is controlled, please have your blood pressure rechecked.  Follow up with your primary care provider for further management of your blood pressure.   If you develop uncontrolled pain, difficulty swallowing or breathing, or are unable to tolerate fluids by mouth, return to the ER immediately.  You may return to the ER at any time for worsening condition or any new symptoms that concern you.  Dental Pain A tooth ache may be caused by cavities (tooth decay). Cavities expose the nerve of the tooth to air and hot or cold temperatures. It may come from an infection or abscess (also called a boil or furuncle) around your tooth. It is also often caused by dental caries (tooth decay). This causes the pain you are having. DIAGNOSIS  Your caregiver can diagnose this problem by exam. TREATMENT   If caused by an infection, it may be treated with medications which kill germs (antibiotics) and pain medications as prescribed by your caregiver. Take medications as directed.   Only take over-the-counter or prescription medicines for pain, discomfort, or fever as directed by your caregiver.   Whether the tooth ache today is caused by infection or dental disease, you should see your dentist as soon as possible for further care.  SEEK MEDICAL CARE IF: The exam and treatment you received today has been provided on an emergency basis only. This is not a substitute for complete medical or dental care. If your problem worsens or new problems (symptoms) appear, and you are unable to meet with your dentist, call or return to this location. SEEK IMMEDIATE MEDICAL CARE IF:   You have a fever.   You develop redness and swelling of your face, jaw, or neck.   You are unable to open your mouth.   You have severe pain uncontrolled by pain medicine.  MAKE SURE YOU:   Understand  these instructions.   Will watch your condition.   Will get help right away if you are not doing well or get worse.  Document Released: 02/08/2005 Document Revised: 01/28/2011 Document Reviewed: 09/27/2007 [facility] Patient Information 2012 [place], [place].  Headache, General, Unknown Cause The specific cause of your headache may not have been found today. There are many causes and types of headache. A few common ones are:  Tension headache.   Migraine.   Infections (examples: dental and sinus infections).   Bone and/or joint problems in the neck or jaw.   Depression.   Eye problems.  These headaches are not life threatening.  Headaches can sometimes be diagnosed by a patient history and a physical exam. Sometimes, lab and imaging studies (such as x-ray and/or CT scan) are used to rule out more serious problems. In some cases, a spinal tap (lumbar puncture) may be requested. There are many times when your exam and tests may be normal on the first visit even when there is a serious problem causing your headaches. Because of that, it is very important to follow up with your doctor or local clinic for further evaluation. FINDING OUT THE RESULTS OF TESTS  If a radiology test was performed, a radiologist will review your results.   You will be contacted by the emergency department or your physician if any test results require a change in your treatment plan.   Not all test results may be available during your visit. If  your test results are not back during the visit, make an appointment with your caregiver to find out the results. Do not assume everything is normal if you have not heard from your caregiver or the medical facility. It is important for you to follow up on all of your test results.  HOME CARE INSTRUCTIONS   Keep follow-up appointments with your caregiver, or any specialist referral.   Only take over-the-counter or prescription medicines for pain, discomfort, or fever as  directed by your caregiver.   Biofeedback, massage, or other relaxation techniques may be helpful.   Ice packs or heat applied to the head and neck can be used. Do this three to four times per day, or as needed.   Call your doctor if you have any questions or concerns.   If you smoke, you should quit.  SEEK MEDICAL CARE IF:   You develop problems with medications prescribed.   You do not respond to or obtain relief from medications.   You have a change from the usual headache.   You develop nausea or vomiting.  SEEK IMMEDIATE MEDICAL CARE IF:   If your headache becomes severe.   You have an unexplained oral temperature above 102 F (38.9 C), or as your caregiver suggests.   You have a stiff neck.   You have loss of vision.   You have muscular weakness.   You have loss of muscular control.   You develop severe symptoms different from your first symptoms.   You start losing your balance or have trouble walking.   You feel faint or pass out.  MAKE SURE YOU:   Understand these instructions.   Will watch your condition.   Will get help right away if you are not doing well or get worse.  Document Released: 02/08/2005 Document Revised: 01/28/2011 Document Reviewed: 09/28/2007 [facility] Patient Information 2012 [place], [place].  Hypertension Information As your heart beats, it forces blood through your arteries. This force is your blood pressure. If the pressure is too high, it is called hypertension (HTN) or high blood pressure. HTN is dangerous because you may have it and not know it. High blood pressure may mean that your heart has to work harder to pump blood. Your arteries may be narrow or stiff. The extra work puts you at risk for heart disease, stroke, and other problems.  Blood pressure consists of two numbers, a higher number over a lower, 110/72, for example. It is stated as "110 over 72." The ideal is below 120 for the top number (systolic) and under 80 for the  bottom (diastolic).  You should pay close attention to your blood pressure if you have certain conditions such as:  Heart failure.   Prior heart attack.   Diabetes   Chronic kidney disease.   Prior stroke.   Multiple risk factors for heart disease.  To see if you have HTN, your blood pressure should be measured while you are seated with your arm held at the level of the heart. It should be measured at least twice. A one-time elevated blood pressure reading (especially in the Emergency Department) does not mean that you need treatment. There may be conditions in which the blood pressure is different between your right and left arms. It is important to see your caregiver soon for a recheck. Most people have essential hypertension which means that there is not a specific cause. This type of high blood pressure may be lowered by changing lifestyle factors such as:  Stress.   Smoking.   Lack of exercise.   Excessive weight.   Drug/tobacco/alcohol use.   Eating less salt.  Most people do not have symptoms from high blood pressure until it has caused damage to the body. Effective treatment can often prevent, delay or reduce that damage. TREATMENT  Treatment for high blood pressure, when a cause has been identified, is directed at the cause. There are a large number of medications to treat HTN. These fall into several categories, and your caregiver will help you select the medicines that are best for you. Medications may have side effects. You should review side effects with your caregiver. If your blood pressure stays high after you have made lifestyle changes or started on medicines,   Your medication(s) may need to be changed.   Other problems may need to be addressed.   Be certain you understand your prescriptions, and know how and when to take your medicine.   Be sure to follow up with your caregiver within the time frame advised (usually within two weeks) to have your blood  pressure rechecked and to review your medications.   If you are taking more than one medicine to lower your blood pressure, make sure you know how and at what times they should be taken. Taking two medicines at the same time can result in blood pressure that is too low.  Document Released: 04/13/2005 Document Revised: 10/21/2010 Document Reviewed: 04/20/2007 [facility] Patient Information 2012 [place], [place].

## 2011-05-20 NOTE — ED Notes (Signed)
Pt st's was here yesterday for toothache and given Rx for pain.  After taking this this has had a headache with nausea and vomiting.

## 2011-05-30 NOTE — ED Provider Notes (Signed)
Evaluation and management procedures were performed by the PA/NP/Resident Physician under my supervision/collaboration.   Rodnisha Blomgren D Merlean Pizzini, MD 05/30/11 1549 

## 2011-10-04 ENCOUNTER — Emergency Department (HOSPITAL_COMMUNITY)
Admission: EM | Admit: 2011-10-04 | Discharge: 2011-10-04 | Disposition: A | Discharge status: Home / Self Care | Payer: PRIVATE HEALTH INSURANCE | Attending: Emergency Medicine | Admitting: Emergency Medicine

## 2011-10-04 ENCOUNTER — Encounter (HOSPITAL_COMMUNITY): Payer: Self-pay | Admitting: *Deleted

## 2011-10-04 DIAGNOSIS — F17200 Nicotine dependence, unspecified, uncomplicated: Secondary | ICD-10-CM | POA: Insufficient documentation

## 2011-10-04 DIAGNOSIS — R51 Headache: Secondary | ICD-10-CM

## 2011-10-04 DIAGNOSIS — M129 Arthropathy, unspecified: Secondary | ICD-10-CM | POA: Insufficient documentation

## 2011-10-04 MED ORDER — IBUPROFEN 800 MG PO TABS: 800.0000 mg | ORAL_TABLET | Freq: Three times a day (TID) | ORAL | Status: AC | PRN

## 2011-10-04 MED ORDER — IBUPROFEN 800 MG PO TABS
800.0000 mg | ORAL_TABLET | Freq: Once | ORAL | Status: AC
  Administered 2011-10-04: 800 mg via ORAL
  Filled 2011-10-04: qty 1

## 2011-10-04 NOTE — ED Provider Notes (Signed)
History     CSN: [id number]  Arrival date & time 10/04/11  9629   First MD Initiated Contact with Patient 10/04/11 [phone]      Chief Complaint  Patient presents with  . Headache    (Consider location/radiation/quality/duration/timing/severity/associated sxs/prior treatment) HPI Comments: Gloria Fletcher 36 y.o. female   The chief complaint is: Patient presents with:   Headache    The onset of the symptoms was yesterday morning.  The Course is  Intermittent right sided occipital pain. Came on gradually.  Took advil which eased symptoms.  Claims no HX of HA./  She is stressed out with job. Company secretary, does heavy lifting. Difficulty sleeping last night. Denies fevers, chills, myalgias, arthralgias, nausea, vomiting, diarrhea. Denies phot/phonophobia, eakness, visula changes, difficulty with speech, constipation, CP, SOB, abdominal pain.       Patient is a 36 y.o. female presenting with headaches. The history is provided by the patient. No language interpreter was used.  Headache  Pertinent negatives include no fever, no palpitations, no shortness of breath, no nausea and no vomiting.    Past Medical History  Diagnosis Date  . Arthritis     generalized  . Abnormal Pap smear     Past Surgical History  Procedure Date  . Cesarean section 2002  . Other surgical history     c-section    History reviewed. No pertinent family history.  History  Substance Use Topics  . Smoking status: Current Everyday Smoker -- 0.2 packs/day for 18 years    Types: Cigarettes  . Smokeless tobacco: Never Used  . Alcohol Use: No    OB History    Grav Para Term Preterm Abortions TAB SAB Ect Mult Living   2 2 2       2       Review of Systems  Constitutional: Negative for fever, chills and fatigue.  HENT: Positive for facial swelling. Negative for hearing loss, ear pain, congestion, sneezing, trouble swallowing, neck pain, neck stiffness, dental problem and postnasal  drip.   Eyes: Positive for redness. Negative for photophobia, pain, discharge and visual disturbance.  Respiratory: Negative for cough and shortness of breath.   Cardiovascular: Negative for chest pain and palpitations.  Gastrointestinal: Negative for nausea, vomiting, abdominal pain, diarrhea and constipation.  Genitourinary: Negative for dysuria, urgency, frequency and hematuria.  Musculoskeletal: Negative for back pain.  Neurological: Positive for headaches. Negative for speech difficulty, weakness and numbness.    Allergies  Review of patient's allergies indicates no known allergies.  Home Medications   Current Outpatient Rx  Name Route Sig Dispense Refill  . IBUPROFEN 200 MG PO TABS Oral Take 400 mg by mouth every 6 (six) hours as needed. As needed for pain.      BP 111/62  Pulse 88  Temp 98.3 F (36.8 C) (Oral)  Resp 16  SpO2 98%  Breastfeeding? Unknown  Physical Exam  Nursing note and vitals reviewed. Constitutional: She is oriented to person, place, and time. She appears well-developed and well-nourished. No distress.       Patient is clenching jaw during the interview  HENT:  Head: Normocephalic and atraumatic.  Right Ear: Hearing normal.  Left Ear: Hearing normal.  Nose: Right sinus exhibits no maxillary sinus tenderness and no frontal sinus tenderness. Left sinus exhibits no maxillary sinus tenderness and no frontal sinus tenderness.  Eyes: Conjunctivae and EOM are normal. Pupils are equal, round, and reactive to light. Right eye exhibits no chemosis and no discharge. Left eye  exhibits no chemosis and no discharge. Right conjunctiva is not injected. Left conjunctiva is not injected. No scleral icterus. Right eye exhibits normal extraocular motion and no nystagmus. Left eye exhibits normal extraocular motion and no nystagmus.       Surrounding tissue is swollen and red, Denies lacrimation or rhinorrhea. Denies recent crying.  Neck: Normal range of motion.        Normal ROM. Point tenderness at the occipital insertion site of the trapezius on the right side. Tenderness in right sub occipital mm.  Cardiovascular: Normal rate, regular rhythm and normal heart sounds.  Exam reveals no gallop and no friction rub.   No murmur heard. Pulmonary/Chest: Effort normal and breath sounds normal. No respiratory distress.  Abdominal: Soft. Bowel sounds are normal. She exhibits no distension and no mass. There is no tenderness. There is no guarding.  Musculoskeletal: Normal range of motion.  Neurological: She is alert and oriented to person, place, and time. She has normal reflexes. No cranial nerve deficit. She exhibits normal muscle tone. Coordination normal.  Skin: Skin is warm and dry. She is not diaphoretic.    ED Course  Procedures (including critical care time)  Labs Reviewed - No data to display No results found. 7:47 AM BP 111/62  Pulse 88  Temp 98.3 F (36.8 C) (Oral)  Resp 16  SpO2 98%  Breastfeeding? Unknown Patient  Seen and evaluated without meningeal sxs, stroke, migraine, or cluster sxs.  PE suggests tesion HA. Will d/c home with NSAIDS.   1. Headache       MDM  Discharge the patient. Discussed reasons to seek immediate care. Patient expresses understanding and agrees with plan.         Arthor Captain, PA-C 10/04/11 [phone]

## 2011-10-04 NOTE — ED Notes (Signed)
Pt states HA that has not allowed her to go to sleep. Pt states HA in generalized not able to pin point specific area/ pt ambulatory and alert and oriented, able to follow commands and move extremities.  Pt states the last HA she had was with a toothache. Pt denies toothache at this time.

## 2011-10-04 NOTE — ED Provider Notes (Signed)
Complains of occipital headache gradual onset yesterday typical of tension headaches she's had in the past. Get similar headaches approximately once per month. On exam awake alert Glasgow Coma Score 15 . Ambulatory without difficulty.  Doug Sou, MD 10/04/11 [phone]

## 2011-10-04 NOTE — ED Provider Notes (Signed)
Medical screening examination/treatment/procedure(s) were conducted as a shared visit with non-physician practitioner(s) and myself.  I personally evaluated the patient during the encounter  Doug Sou, MD 10/04/11 [phone]

## 2013-12-24 ENCOUNTER — Encounter (HOSPITAL_COMMUNITY): Payer: Self-pay | Admitting: *Deleted

## 2018-03-27 ENCOUNTER — Encounter (HOSPITAL_COMMUNITY): Payer: Self-pay | Admitting: Emergency Medicine

## 2018-03-27 ENCOUNTER — Other Ambulatory Visit: Payer: Self-pay

## 2018-03-27 ENCOUNTER — Ambulatory Visit (HOSPITAL_COMMUNITY)
Admission: EM | Admit: 2018-03-27 | Discharge: 2018-03-27 | Disposition: A | Discharge status: Home / Self Care | Payer: PRIVATE HEALTH INSURANCE

## 2018-03-27 DIAGNOSIS — J0100 Acute maxillary sinusitis, unspecified: Secondary | ICD-10-CM

## 2018-03-27 DIAGNOSIS — Z720 Tobacco use: Secondary | ICD-10-CM

## 2018-03-27 DIAGNOSIS — J3089 Other allergic rhinitis: Secondary | ICD-10-CM

## 2018-03-27 MED ORDER — PREDNISONE 20 MG PO TABS: ORAL_TABLET | ORAL | 0 refills | Status: AC

## 2018-03-27 MED ORDER — AMOXICILLIN 500 MG PO CAPS: 500.0000 mg | ORAL_CAPSULE | Freq: Three times a day (TID) | ORAL | 0 refills | Status: DC

## 2018-03-27 NOTE — ED Provider Notes (Signed)
MRN: [id number] DOB: 05/11/1975  Subjective:   Gloria Fletcher is a 43 y.o. female presenting for 3-day history of recurrent sinusitis.  Patient reports that she has had general malaise, moderate to severe right-sided facial pain, sore teeth on that side as well.  She is not having right ear and neck discomfort.  She took 1 Benadryl this morning with minimal relief.  Patient smokes about 1/2 pack/day.  Started having a mild cough this morning.  Denies fever, chest pain, shortness of breath, wheezing, nausea, vomiting, belly pain, rashes, confusion, dizziness, weakness.  Denies history of chronic conditions, does not take chronic medications.  No current facility-administered medications for this encounter.   Current Outpatient Medications:  .  diphenhydrAMINE (BENADRYL) 25 MG tablet, Take 25 mg by mouth every 6 (six) hours as needed., Disp: , Rfl:  .  naproxen sodium (ALEVE) 220 MG tablet, Take 220 mg by mouth., Disp: , Rfl:     No Known Allergies   Past Medical History:  Diagnosis Date  . Abnormal Pap smear   . Arthritis    generalized     Past Surgical History:  Procedure Laterality Date  . CESAREAN SECTION  2002  . OTHER SURGICAL HISTORY     c-section    ROS  Objective:   Vitals: BP 125/76 (BP Location: Right Arm)   Pulse 74   Temp 98 F (36.7 C) (Temporal)   Resp 18   LMP 03/06/2018   SpO2 99%   Physical Exam Constitutional:      General: She is not in acute distress.    Appearance: Normal appearance. She is well-developed. She is not ill-appearing, toxic-appearing or diaphoretic.  HENT:     Head: Normocephalic and atraumatic.     Right Ear: Tympanic membrane and ear canal normal. No drainage or tenderness. No middle ear effusion. Tympanic membrane is not erythematous.     Left Ear: Tympanic membrane and ear canal normal. No drainage or tenderness.  No middle ear effusion. Tympanic membrane is not erythematous.     Nose: No congestion or rhinorrhea.      Right Sinus: Maxillary sinus tenderness present.     Left Sinus: No maxillary sinus tenderness.     Mouth/Throat:     Mouth: Mucous membranes are moist. No oral lesions.     Pharynx: Oropharynx is clear. No pharyngeal swelling, oropharyngeal exudate, posterior oropharyngeal erythema or uvula swelling.     Tonsils: No tonsillar exudate or tonsillar abscesses.  Eyes:     Extraocular Movements: Extraocular movements intact.     Right eye: Normal extraocular motion.     Left eye: Normal extraocular motion.     Conjunctiva/sclera: Conjunctivae normal.     Pupils: Pupils are equal, round, and reactive to light.  Neck:     Musculoskeletal: Normal range of motion and neck supple.  Cardiovascular:     Rate and Rhythm: Normal rate and regular rhythm.     Pulses: Normal pulses.     Heart sounds: Normal heart sounds. No murmur. No friction rub. No gallop.   Pulmonary:     Effort: Pulmonary effort is normal. No respiratory distress.     Breath sounds: Normal breath sounds. No stridor. No wheezing, rhonchi or rales.  Lymphadenopathy:     Cervical: No cervical adenopathy.  Skin:    General: Skin is warm and dry.     Findings: No rash.  Neurological:     General: No focal deficit present.     Mental Status:  She is alert and oriented to person, place, and time.  Psychiatric:        Mood and Affect: Mood normal.        Behavior: Behavior normal.        Thought Content: Thought content normal.     Assessment and Plan :   Acute non-recurrent maxillary sinusitis  Tobacco user  Allergic rhinitis due to other allergic trigger, unspecified seasonality  Will cover for recurrent sinusitis with amoxicillin and a prednisone course given her chronic allergic rhinitis and smoking.  Emphasized need to quit smoking.  Start daily antihistamine for maintenance management of chronic allergic rhinitis. Counseled patient on potential for adverse effects with medications prescribed today, patient  verbalized understanding. ER and return-to-clinic precautions discussed, patient verbalized understanding.    Wallis BambergMani, Daeshawn Redmann, PA-C 03/27/18 1256

## 2018-03-27 NOTE — Discharge Instructions (Signed)
We will manage this as a viral syndrome. For sore throat or cough try using a honey-based tea. Use 3 teaspoons of honey with juice squeezed from half lemon. Place shaved pieces of ginger into 1/2-1 cup of water and warm over stove top. Then mix the ingredients and repeat every 4 hours as needed. Please take ibuprofen 400mg  every 6 hours alternating with OR taken together with Tylenol 500mg  every 6 hours. Hydrate very well with at least 2 liters of water. Eat light meals such as soups to replenish electrolytes and soft fruits, veggies. Start Zyrtec and Claritin together once daily. Use Sudafed (pseudoephedrine) 120mg  twice daily as needed for congestion. This can be used when you're not on prednisone.

## 2018-03-27 NOTE — ED Triage Notes (Signed)
Complains of symptoms starting on Friday.  Facial pressure on right side of face pain in teeth and throat. Has a stuffy nose on right side of face.

## 2019-07-30 ENCOUNTER — Encounter (HOSPITAL_COMMUNITY): Payer: Self-pay

## 2019-07-30 ENCOUNTER — Other Ambulatory Visit: Payer: Self-pay

## 2019-07-30 ENCOUNTER — Ambulatory Visit (HOSPITAL_COMMUNITY)
Admission: EM | Admit: 2019-07-30 | Discharge: 2019-07-30 | Disposition: A | Discharge status: Home / Self Care | Payer: PRIVATE HEALTH INSURANCE | Attending: Family Medicine | Admitting: Family Medicine

## 2019-07-30 DIAGNOSIS — M545 Low back pain, unspecified: Secondary | ICD-10-CM

## 2019-07-30 MED ORDER — IBUPROFEN 800 MG PO TABS: 800.0000 mg | ORAL_TABLET | Freq: Three times a day (TID) | ORAL | 0 refills | Status: AC | PRN

## 2019-07-30 MED ORDER — CYCLOBENZAPRINE HCL 10 MG PO TABS: 10.0000 mg | ORAL_TABLET | Freq: Two times a day (BID) | ORAL | 0 refills | Status: DC | PRN

## 2019-07-30 NOTE — Discharge Instructions (Addendum)
Take the prescribed ibuprofen as needed for your pain.  Take the muscle relaxer Flexeril as needed for muscle spasm; do not drive, operate machinery, or drink alcohol with this medication as it may make you drowsy.    Follow up with an orthopedist if your pain is not improving.  Go to the emergency department if you have worsening pain or develop new symptoms such as difficulty with urination, weakness, numbness, loss of control of your bladder or bowels, fever, chills or other concerns.  

## 2019-07-30 NOTE — ED Triage Notes (Signed)
Pt presents with right side back pain that is not injury related.

## 2019-07-30 NOTE — ED Provider Notes (Signed)
[facility]   [id number] 07/30/19 Arrival Time: 0850  RC:VELFY PAIN  SUBJECTIVE: History from: patient. Gloria Fletcher is a 44 y.o. female complains of right sided back pain that began 4 days ago. Denies a precipitating event or specific injury. Does report that she has been working 6 days/week. Reports that she has to wear steel toe boots and walks on concrete floors all day. Describes the pain as intermittent and achy in character. Has tried tylenol medications without relief. Symptoms are made worse with activity. Has used heat with some relief. Denies similar symptoms in the past.  Denies fever, chills, erythema, ecchymosis, effusion, weakness, numbness and tingling, saddle paresthesias, loss of bowel or bladder function.      ROS: As per HPI.  All other pertinent ROS negative.     Past Medical History:  Diagnosis Date  . Abnormal Pap smear   . Arthritis    generalized   Past Surgical History:  Procedure Laterality Date  . CESAREAN SECTION  2002  . OTHER SURGICAL HISTORY     c-section   No Known Allergies No current facility-administered medications on file prior to encounter.   Current Outpatient Medications on File Prior to Encounter  Medication Sig Dispense Refill  . amoxicillin (AMOXIL) 500 MG capsule Take 1 capsule (500 mg total) by mouth 3 (three) times daily. 21 capsule 0  . diphenhydrAMINE (BENADRYL) 25 MG tablet Take 25 mg by mouth every 6 (six) hours as needed.    . naproxen sodium (ALEVE) 220 MG tablet Take 220 mg by mouth.    . predniSONE (DELTASONE) 20 MG tablet Take 2 tablets daily with breakfast. 10 tablet 0   Social History   Socioeconomic History  . Marital status: Married    Spouse name: Not on file  . Number of children: Not on file  . Years of education: Not on file  . Highest education level: Not on file  Occupational History  . Not on file  Tobacco Use  . Smoking status: Current Every Day Smoker    Packs/day: 0.25   Years: 18.00    Pack years: 4.50    Types: Cigarettes  . Smokeless tobacco: Never Used  Substance and Sexual Activity  . Alcohol use: Yes  . Drug use: No  . Sexual activity: Yes  Other Topics Concern  . Not on file  Social History Narrative  . Not on file   Social Determinants of Health   Financial Resource Strain:   . Difficulty of Paying Living Expenses:   Food Insecurity:   . Worried About Programme researcher, broadcasting/film/video in the Last Year:   . Barista in the Last Year:   Transportation Needs:   . Freight forwarder (Medical):   Marland Kitchen Lack of Transportation (Non-Medical):   Physical Activity:   . Days of Exercise per Week:   . Minutes of Exercise per Session:   Stress:   . Feeling of Stress :   Social Connections:   . Frequency of Communication with Friends and Family:   . Frequency of Social Gatherings with Friends and Family:   . Attends Religious Services:   . Active Member of Clubs or Organizations:   . Attends Banker Meetings:   Marland Kitchen Marital Status:   Intimate Partner Violence:   . Fear of Current or Ex-Partner:   . Emotionally Abused:   Marland Kitchen Physically Abused:   . Sexually Abused:    Family History  Problem Relation Age of  Onset  . Healthy Mother   . Healthy Father     OBJECTIVE:  Vitals:   07/30/19 0943  BP: 127/84  Pulse: 74  Resp: 18  Temp: 98.8 F (37.1 C)  TempSrc: Oral  SpO2: 98%    General appearance: ALERT; in no acute distress.  Head: NCAT Lungs: Normal respiratory effort CV: radial and pedal pulses 2+ bilaterally. Cap refill < 2 seconds Musculoskeletal:  Inspection: Skin warm, dry, clear and intact without obvious erythema, effusion, or ecchymosis.  Palpation: Nontender to palpation ROM: FROM active and passive Skin: warm and dry Neurologic: Ambulates without difficulty; Sensation intact about the upper/ lower extremities Psychological: alert and cooperative; normal mood and affect  DIAGNOSTIC STUDIES:  No results found.    ASSESSMENT & PLAN:  1. Acute right-sided low back pain without sciatica     Meds ordered this encounter  Medications  . cyclobenzaprine (FLEXERIL) 10 MG tablet    Sig: Take 1 tablet (10 mg total) by mouth 2 (two) times daily as needed for muscle spasms.    Dispense:  20 tablet    Refill:  0    Order Specific Question:   Supervising Provider    Answer:   Chase Picket [id number]  . ibuprofen (ADVIL) 800 MG tablet    Sig: Take 1 tablet (800 mg total) by mouth every 8 (eight) hours as needed for moderate pain.    Dispense:  21 tablet    Refill:  0    Order Specific Question:   Supervising Provider    Answer:   Chase Picket [[id number]]   Back pain Muscle strain Continue conservative management of rest, ice, and gentle stretches Take ibuprofen as needed for pain relief (may cause abdominal discomfort, ulcers, and GI bleeds avoid taking with other NSAIDs) Take cyclobenzaprine at nighttime for symptomatic relief. Avoid driving or operating heavy machinery while using medication. Follow up with PCP if symptoms persist Return or go to the ER if you have any new or worsening symptoms (fever, chills, chest pain, abdominal pain, changes in bowel or bladder habits, pain radiating into lower legs)   Reviewed expectations re: course of current medical issues. Questions answered. Outlined signs and symptoms indicating need for more acute intervention. Patient verbalized understanding. After Visit Summary given.       Faustino Congress, NP 07/30/19 1011

## 2020-05-23 ENCOUNTER — Emergency Department (HOSPITAL_COMMUNITY)
Admission: EM | Admit: 2020-05-23 | Discharge: 2020-05-23 | Disposition: A | Discharge status: Home / Self Care | Payer: No Typology Code available for payment source | Attending: Emergency Medicine | Admitting: Emergency Medicine

## 2020-05-23 ENCOUNTER — Emergency Department (HOSPITAL_COMMUNITY): Payer: No Typology Code available for payment source

## 2020-05-23 ENCOUNTER — Encounter (HOSPITAL_COMMUNITY): Payer: Self-pay

## 2020-05-23 DIAGNOSIS — R202 Paresthesia of skin: Secondary | ICD-10-CM | POA: Insufficient documentation

## 2020-05-23 DIAGNOSIS — W1842XA Slipping, tripping and stumbling without falling due to stepping into hole or opening, initial encounter: Secondary | ICD-10-CM | POA: Insufficient documentation

## 2020-05-23 DIAGNOSIS — M79605 Pain in left leg: Secondary | ICD-10-CM | POA: Insufficient documentation

## 2020-05-23 DIAGNOSIS — Y92512 Supermarket, store or market as the place of occurrence of the external cause: Secondary | ICD-10-CM | POA: Diagnosis not present

## 2020-05-23 DIAGNOSIS — M62830 Muscle spasm of back: Secondary | ICD-10-CM | POA: Insufficient documentation

## 2020-05-23 DIAGNOSIS — F17210 Nicotine dependence, cigarettes, uncomplicated: Secondary | ICD-10-CM | POA: Insufficient documentation

## 2020-05-23 DIAGNOSIS — W19XXXA Unspecified fall, initial encounter: Secondary | ICD-10-CM

## 2020-05-23 DIAGNOSIS — Y990 Civilian activity done for income or pay: Secondary | ICD-10-CM | POA: Diagnosis not present

## 2020-05-23 MED ORDER — METHOCARBAMOL 500 MG PO TABS: 500.0000 mg | ORAL_TABLET | Freq: Two times a day (BID) | ORAL | 0 refills | Status: DC

## 2020-05-23 MED ORDER — LIDOCAINE 5 % EX PTCH
1.0000 | MEDICATED_PATCH | CUTANEOUS | Status: DC
  Administered 2020-05-23: 1 via TRANSDERMAL
  Filled 2020-05-23: qty 1

## 2020-05-23 MED ORDER — METHOCARBAMOL 500 MG PO TABS: 500.0000 mg | ORAL_TABLET | Freq: Two times a day (BID) | ORAL | 0 refills | Status: AC

## 2020-05-23 MED ORDER — KETOROLAC TROMETHAMINE 30 MG/ML IJ SOLN
30.0000 mg | Freq: Once | INTRAMUSCULAR | Status: AC
  Administered 2020-05-23: 30 mg via INTRAMUSCULAR
  Filled 2020-05-23: qty 1

## 2020-05-23 NOTE — ED Notes (Signed)
Verbalized understanding discharge instructions, prescriptions, and follow-up. In no acute distress.   

## 2020-05-23 NOTE — Discharge Instructions (Signed)
You were seen in the ER today after you fall at work . Your physical exam, vital signs, and xrays were very reassuring.  You do not have any broken bones.  Your likely sore due to bruising and muscle strain from the way that you feel in your legs.    The muscles in your buttocks and hips are in what is called spasm, meaning they are inappropriately tightened up.  This can be quite painful.  To help with your pain you may take Tylenol and / or NSAID medication (such as ibuprofen or naproxen) to help with your pain.  Additionally, you have been prescribed a muscle relaxer called Robaxin to help relieve some of the muscle spasm.  Please be advised that this medication may make you very sleepy, so you should not drive or operate heavy machinery while you are taking it.  You may also utilize topical pain relief such as Biofreeze, IcyHot, or topical lidocaine patches.  I also recommend that you apply heat to the area, such as a hot shower or heating pad, and follow heat application with massage of the muscles that are most tight.  Please return to the emergency department if you develop any numbness/tingling/weakness in your arms or legs, any difficulty urinating, or urinary incontinence chest pain, shortness of breath, abdominal pain, nausea or vomiting that does not stop, or any other new severe symptoms.

## 2020-05-23 NOTE — ED Triage Notes (Signed)
Pt presents with c/o fall that occurred yesterday. Pt reports she was seen at [facility] but the medicine she received there is not working. Pt c/o bilateral hip pain, back pain, and left leg pain.

## 2020-05-23 NOTE — ED Notes (Signed)
Pt reports being prescribed cyclobenzaprine and etodolac by UC.  Sts "it's like taking candy."

## 2020-05-23 NOTE — ED Provider Notes (Signed)
[facility]-EMERGENCY DEPT Provider Note   CSN: [id number] Arrival date & time: 05/23/20  1312     History Chief Complaint  Patient presents with  . Fall    Gloria Fletcher is a 45 y.o. 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  When this happened she fell back directly onto her back and buttocks, primarily on the left side.   She states she was seen in urgent car immediately after the fall yesterday with a prescribed her flexeril and etodolac. She believes she underwent xrays but she does not have a copy of the results and states they told her she has contusions to her leg.   She presents today due to worsening pain, not controlled by medications prescribed by the UC. She denies head trauma, LOC, N/V, blurry or double vision since the fall.   She states that when she fell yesterday she urinated a very small amount on herself, however states that she was hurrying to try to get to the restroom prior to her fall does not feel like this was true incontinence.  She denies any incontinence since that time, but does endorse numbness in her right leg and tingling down her left.  She denies any saddle anesthesia denies any weakness in her leg so she has had difficulty ambulating on her left leg due to the pain.  I personally reviewed this patient's medical records.  She does has history of arthritis, but is not on any medications every day.  HPI     Past Medical History:  Diagnosis Date  . Abnormal Pap smear   . Arthritis    generalized    There are no problems to display for this patient.   Past Surgical History:  Procedure Laterality Date  . CESAREAN SECTION  2002  . OTHER SURGICAL HISTORY     c-section     OB History    Gravida  2   Para  2   Term  2   Preterm      AB      Living  2     SAB      IAB      Ectopic      Multiple      Live Births  2            Family History  Problem Relation Age of Onset  . Healthy Mother   . Healthy Father     Social History   Tobacco Use  . Smoking status: Current Every Day Smoker    Packs/day: 0.25    Years: 18.00    Pack years: 4.50    Types: Cigarettes  . Smokeless tobacco: Never Used  Substance Use Topics  . Alcohol use: Yes  . Drug use: No    Home Medications Prior to Admission medications   Medication Sig Start Date End Date Taking? Authorizing Provider  methocarbamol (ROBAXIN) 500 MG tablet Take 1 tablet (500 mg total) by mouth 2 (two) times daily. 05/23/20  Yes Kalijah Zeiss R, PA-C  amoxicillin (AMOXIL) 500 MG capsule Take 1 capsule (500 mg total) by mouth 3 (three) times daily. 03/27/18   Wallis Bamberg, PA-C  cyclobenzaprine (FLEXERIL) 10 MG tablet Take 1 tablet (10 mg total) by mouth 2 (two) times daily as needed for muscle spasms. 07/30/19   Moshe Cipro, NP  diphenhydrAMINE (BENADRYL) 25 MG tablet Take 25 mg by  mouth every 6 (six) hours as needed.    [provider]  ibuprofen (ADVIL) 800 MG tablet Take 1 tablet (800 mg total) by mouth every 8 (eight) hours as needed for moderate pain. 07/30/19   Moshe Cipro, NP  naproxen sodium (ALEVE) 220 MG tablet Take 220 mg by mouth.    [provider]  predniSONE (DELTASONE) 20 MG tablet Take 2 tablets daily with breakfast. 03/27/18   Wallis Bamberg, PA-C    Allergies    Patient has no known allergies.  Review of Systems   Review of Systems  Constitutional: Negative.   HENT: Negative.   Respiratory: Negative.   Cardiovascular: Negative.   Gastrointestinal: Negative.   Musculoskeletal: Positive for arthralgias and myalgias.  Neurological: Positive for numbness.  Hematological: Does not bruise/bleed easily.    Physical Exam Updated Vital Signs BP (!) 149/111 (BP Location: Right Arm)   Pulse (!) 108   Temp 98.8 F (37.1 C) (Oral)   Resp 20   LMP 05/23/2020 (Approximate)   SpO2 96%   Physical  Exam Vitals and nursing note reviewed.  Constitutional:      Appearance: She is obese. She is not ill-appearing.  HENT:     Head: Normocephalic and atraumatic.     Nose: Nose normal.     Mouth/Throat:     Mouth: Mucous membranes are moist.     Pharynx: Oropharynx is clear. Uvula midline. No oropharyngeal exudate, posterior oropharyngeal erythema or uvula swelling.     Tonsils: No tonsillar exudate.  Eyes:     General: Lids are normal. Vision grossly intact.        Right eye: No discharge.        Left eye: No discharge.     Extraocular Movements: Extraocular movements intact.     Conjunctiva/sclera: Conjunctivae normal.     Pupils: Pupils are equal, round, and reactive to light.  Neck:     Trachea: Trachea and phonation normal.  Cardiovascular:     Rate and Rhythm: Normal rate and regular rhythm.     Pulses: Normal pulses.          Radial pulses are 2+ on the right side and 2+ on the left side.       Dorsalis pedis pulses are 2+ on the right side and 2+ on the left side.     Heart sounds: Normal heart sounds. No murmur heard.   Pulmonary:     Effort: Pulmonary effort is normal. No tachypnea, bradypnea, accessory muscle usage or respiratory distress.     Breath sounds: Normal breath sounds. No wheezing or rales.  Chest:     Chest wall: No mass, lacerations, deformity, swelling, tenderness, crepitus or edema.  Abdominal:     General: Bowel sounds are normal. There is no distension.     Palpations: Abdomen is soft.     Tenderness: There is no abdominal tenderness. There is no right CVA tenderness, left CVA tenderness, guarding or rebound.  Musculoskeletal:        General: No deformity.     Right shoulder: Normal.     Left shoulder: Normal.     Right upper arm: Normal.     Left upper arm: Tenderness present. No swelling, edema, deformity, lacerations or bony tenderness.     Right elbow: Normal.     Left elbow: Normal.     Right forearm: Normal.     Left forearm: Normal.      Right wrist: Normal.  Left wrist: Normal.     Right hand: Normal.     Left hand: Normal.     Cervical back: Normal, full passive range of motion without pain and neck supple. No rigidity, tenderness, bony tenderness or crepitus. No pain with movement, spinous process tenderness or muscular tenderness.     Thoracic back: Spasms present. No swelling, edema, deformity, signs of trauma, tenderness or bony tenderness.     Lumbar back: Spasms, tenderness and bony tenderness present. Positive left straight leg raise test. Negative right straight leg raise test.     Right hip: Normal.     Left hip: Tenderness and bony tenderness present. No crepitus. Decreased range of motion.     Right upper leg: Normal.     Left upper leg: Tenderness present. No swelling, edema, deformity, lacerations or bony tenderness.     Right knee: Normal.     Left knee: Bony tenderness present. No swelling, deformity, effusion, erythema, ecchymosis, lacerations or crepitus. Tenderness present over the medial joint line. Normal alignment.     Instability Tests: Anterior drawer test negative. Posterior drawer test negative.     Right lower leg: Normal. No edema.     Left lower leg: Normal. No edema.     Right ankle: Normal.     Right Achilles Tendon: Normal.     Left ankle: Normal.     Left Achilles Tendon: Normal.     Right foot: Normal.     Left foot: Normal.  Lymphadenopathy:     Cervical: No cervical adenopathy.  Skin:    General: Skin is warm and dry.     Capillary Refill: Capillary refill takes less than 2 seconds.  Neurological:     Mental Status: She is alert and oriented to person, place, and time. Mental status is at baseline.     Sensory: Sensation is intact.     Motor: Motor function is intact.  Psychiatric:        Mood and Affect: Mood normal.     ED Results / Procedures / Treatments   Labs (all labs ordered are listed, but only abnormal results are displayed) Labs Reviewed - No data to  display  EKG None  Radiology DG Ribs Unilateral W/Chest Left  Result Date: 05/23/2020 CLINICAL DATA:  Fall EXAM: LEFT RIBS AND CHEST - 3+ VIEW COMPARISON:  None. FINDINGS: No fracture or other bone lesions are seen involving the ribs. There is no evidence of pneumothorax or pleural effusion. Both lungs are clear. Heart size and mediastinal contours are within normal limits. IMPRESSION: Negative. Electronically Signed   By: Jasmine Pang M.D.   On: 05/23/2020 15:17   DG Lumbar Spine Complete  Result Date: 05/23/2020 CLINICAL DATA:  Fall EXAM: LUMBAR SPINE - COMPLETE 4+ VIEW COMPARISON:  None. FINDINGS: There is no evidence of lumbar spine fracture. Alignment is normal. Intervertebral disc spaces are maintained. IMPRESSION: Negative. Electronically Signed   By: Jasmine Pang M.D.   On: 05/23/2020 15:19   DG Knee Complete 4 Views Left  Result Date: 05/23/2020 CLINICAL DATA:  Fall EXAM: LEFT KNEE - COMPLETE 4+ VIEW COMPARISON:  None. FINDINGS: No evidence of fracture, dislocation, or joint effusion. No evidence of arthropathy or other focal bone abnormality. Soft tissues are unremarkable. IMPRESSION: Negative. Electronically Signed   By: Jasmine Pang M.D.   On: 05/23/2020 15:18   DG Hip Unilat W or Wo Pelvis 2-3 Views Left  Result Date: 05/23/2020 CLINICAL DATA:  Fall left hip pain EXAM: DG  HIP (WITH OR WITHOUT PELVIS) 2-3V LEFT COMPARISON:  None. FINDINGS: Clips within the right pelvis. The SI joints are non widened. The pubic symphysis and rami appear intact. No acute displaced fracture or malalignment. Joint space is patent IMPRESSION: No acute osseous abnormality. Electronically Signed   By: Jasmine PangKim  Fujinaga M.D.   On: 05/23/2020 15:16    Procedures Procedures   Medications Ordered in ED Medications  lidocaine (LIDODERM) 5 % 1 patch (1 patch Transdermal Patch Applied 05/23/20 1511)  ketorolac (TORADOL) 30 MG/ML injection 30 mg (30 mg Intramuscular Given 05/23/20 1511)    ED Course  I have  reviewed the triage vital signs and the nursing notes.  Pertinent labs & imaging results that were available during my care of the patient were reviewed by me and considered in my medical decision making (see chart for details).    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  Seen at urgent care given etodolac and Flexeril without relief.  Mildly tachycardic to 108 here in the ED, hypertensive, vital signs otherwise normal.  Cardiopulmonary exam is normal, abdominal exam is benign.  There is no deformity or tenderness palpation of the chest, no crepitus.  Upper extremities are intact without signs of trauma there is mild tenderness palpation of the posterior left upper arm without bruising, crepitus, deformity, or mass.  No midline tenderness of the cervical or thoracic spine, there is midline tenderness of the distal lumbar spine and left-sided lumbar paraspinous musculature spasm with associated tenderness palpation, without crepitus.  There is tenderness palpation of the buttocks bilaterally with spasm, tenderness of the left hip without crepitus or deformity or bruising,-palpation of the left knee, decreased hip range of motion due to pain.  Normal pedal pulses bilaterally sensation is intact in lower extremities bilaterally.  Proceed with repeat imaging as patient is on sure of which x-rays were performed does not have documentation visible in her chart.  Will administer Toradol injection in the interim.  Plain films negative for acute fractures or dislocations. Patient feeling much better after administration of Toradol and lidocaine patch.  No further work-up is warranted in the ED at this time.  Pain is likely secondary to muscular strain from her fall yesterday, likely contusions.  Will discharge with course of Robaxin.  Chaelyn voiced understanding  of her medical evaluation treatment plan. Each of her questions was answered to her expressed satisfaction.  Return precautions given.  Patient is stable and appropriate for discharge.  This chart was dictated using voice recognition software, Dragon. Despite the best efforts of this provider to proofread and correct errors, errors may still occur which can change documentation meaning.  Final Clinical Impression(s) / ED Diagnoses Final diagnoses:  Fall, initial encounter    Rx / DC Orders ED Discharge Orders         Ordered    methocarbamol (ROBAXIN) 500 MG tablet  2 times daily        05/23/20 1609           Baneza Bartoszek, Idelia SalmRebekah R, PA-C 05/23/20 1623    Lorre NickAllen, Anthony, MD 05/26/20 0900

## 2020-09-18 ENCOUNTER — Other Ambulatory Visit: Payer: Self-pay | Admitting: Orthopedic Surgery

## 2020-09-18 ENCOUNTER — Other Ambulatory Visit (HOSPITAL_COMMUNITY): Payer: Self-pay | Admitting: Orthopedic Surgery

## 2020-09-18 DIAGNOSIS — M25572 Pain in left ankle and joints of left foot: Secondary | ICD-10-CM

## 2020-09-27 ENCOUNTER — Ambulatory Visit (HOSPITAL_COMMUNITY)
Admission: RE | Admit: 2020-09-27 | Discharge: 2020-09-27 | Disposition: A | Discharge status: Home / Self Care | Payer: No Typology Code available for payment source | Source: Ambulatory Visit | Attending: Orthopedic Surgery | Admitting: Orthopedic Surgery

## 2020-09-27 DIAGNOSIS — M25572 Pain in left ankle and joints of left foot: Secondary | ICD-10-CM | POA: Diagnosis present

## 2020-09-30 ENCOUNTER — Encounter: Payer: Self-pay | Admitting: Neurology

## 2020-10-28 ENCOUNTER — Other Ambulatory Visit: Payer: Self-pay | Admitting: Sports Medicine

## 2020-10-28 DIAGNOSIS — M5416 Radiculopathy, lumbar region: Secondary | ICD-10-CM

## 2020-10-31 ENCOUNTER — Ambulatory Visit
Admission: RE | Admit: 2020-10-31 | Discharge: 2020-10-31 | Disposition: A | Discharge status: Home / Self Care | Payer: Worker's Compensation | Source: Ambulatory Visit | Attending: Sports Medicine | Admitting: Sports Medicine

## 2020-10-31 ENCOUNTER — Other Ambulatory Visit: Payer: Self-pay

## 2020-10-31 DIAGNOSIS — M5416 Radiculopathy, lumbar region: Secondary | ICD-10-CM

## 2020-10-31 MED ORDER — METHYLPREDNISOLONE ACETATE 40 MG/ML INJ SUSP (RADIOLOG
80.0000 mg | Freq: Once | INTRAMUSCULAR | Status: AC
  Administered 2020-10-31: 80 mg via EPIDURAL

## 2020-10-31 MED ORDER — IOPAMIDOL (ISOVUE-M 200) INJECTION 41%
1.0000 mL | Freq: Once | INTRAMUSCULAR | Status: AC
  Administered 2020-10-31: 1 mL via EPIDURAL

## 2020-10-31 NOTE — Discharge Instructions (Signed)

## 2020-11-28 ENCOUNTER — Ambulatory Visit: Payer: PRIVATE HEALTH INSURANCE | Admitting: Neurology

## 2020-12-19 ENCOUNTER — Ambulatory Visit: Payer: PRIVATE HEALTH INSURANCE | Admitting: Neurology

## 2021-02-06 ENCOUNTER — Ambulatory Visit (INDEPENDENT_AMBULATORY_CARE_PROVIDER_SITE_OTHER): Payer: No Typology Code available for payment source | Admitting: Neurology

## 2021-02-06 ENCOUNTER — Encounter: Payer: Self-pay | Admitting: Neurology

## 2021-02-06 ENCOUNTER — Other Ambulatory Visit: Payer: Self-pay

## 2021-02-06 VITALS — BP 135/79 | HR 121 | Ht <= 58 in | Wt 258.0 lb

## 2021-02-06 DIAGNOSIS — R29898 Other symptoms and signs involving the musculoskeletal system: Secondary | ICD-10-CM

## 2021-02-06 NOTE — Patient Instructions (Signed)
If you would like to repeat nerve testing, please contact my office

## 2021-02-06 NOTE — Progress Notes (Signed)
[facility] Clinic Note - Initial Visit   Date: 02/06/2021  CHANDELLE HARKEY MRN: [id number] DOB: [birth year]   Dear Dr.Hewitt:  Thank you for your kind referral of Anysa A Blackwell-Hairston for consultation of left foot drop. Although her history is well known to you, please allow [place] to reiterate it for the purpose of our medical record. The patient was accompanied to the clinic by son who also provides collateral information.     History of Present Illness: Gloria Fletcher is a 45 y.o. ambidextrous-handed female presenting for evaluation of left foot weakness. She fell on March 31st 2022, she slipped on a puddle at work and developed immediate left foot drop and pain. She does not have numbness/tingling.  She has achy pain between the last two toes and ankle. She has been seeing [facility]. She had MRI lumbar spine which did not show any nerve impingement. 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 She denies any right leg or foot symptoms. She has been using a brace for the last 4 months, which has helped.   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    Out-side paper records, electronic medical record, and images have been reviewed where available and summarized as:   NCS/EMG legs at [facility] 09/04/2020:  bilateral tibial neuropathy  MRI lumbar spine 06/17/2020: 1.  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  2.  Mild to moderate posterior process hypertrophy most prominently L4-L5.  3.  Multilevel asymmetric disc bulges, protrusions and early endplate osteophytes.  4.  Mild canal and right lateral recess narrowing L4-L5 without evidence of cord or nerve root impingement.     Past Medical History:  Diagnosis Date   Abnormal Pap smear    Arthritis    generalized    Past Surgical History:  Procedure Laterality Date   CESAREAN SECTION  2002   OTHER SURGICAL  HISTORY     c-section     Medications:  Outpatient Encounter Medications as of 02/06/2021  Medication Sig   gabapentin (NEURONTIN) 300 MG capsule Take 3-4 during the day and 2 at bedtime.   ibuprofen (ADVIL) 800 MG tablet Take 1 tablet (800 mg total) by mouth every 8 (eight) hours as needed for moderate pain.   lidocaine (LIDODERM) 5 % Place 1 patch onto the skin daily. Remove & Discard patch within 12 hours or as directed by MD   diphenhydrAMINE (BENADRYL) 25 MG tablet Take 25 mg by mouth every 6 (six) hours as needed. (Patient not taking: Reported on 02/06/2021)   methocarbamol (ROBAXIN) 500 MG tablet Take 1 tablet (500 mg total) by mouth 2 (two) times daily. (Patient not taking: Reported on 02/06/2021)   naproxen sodium (ALEVE) 220 MG tablet Take 220 mg by mouth. (Patient not taking: Reported on 02/06/2021)   predniSONE (DELTASONE) 20 MG tablet Take 2 tablets daily with breakfast. (Patient not taking: Reported on 02/06/2021)   [DISCONTINUED] amoxicillin (AMOXIL) 500 MG capsule Take 1 capsule (500 mg total) by mouth 3 (three) times daily. (Patient not taking: Reported on 02/06/2021)   [DISCONTINUED] cyclobenzaprine (FLEXERIL) 10 MG tablet Take 1 tablet (10 mg total) by mouth 2 (two) times daily as needed for muscle spasms. (Patient not taking: Reported on 02/06/2021)   No facility-administered encounter medications on file as of 02/06/2021.    Allergies: No Known Allergies  Family History: Family History  Problem Relation Age of Onset   Healthy Mother  Healthy Father     Social History: Social History   Tobacco Use   Smoking status: Every Day    Packs/day: 0.25    Years: 18.00    Pack years: 4.50    Types: Cigarettes   Smokeless tobacco: Never  Substance Use Topics   Alcohol use: Yes   Drug use: No   Social History   Social History Narrative   5 Grandbabies    2 step children 2 biological children      Left and Right Handed    Lives in a one story home.  13  steps to front door     Vital Signs:  BP 135/79    Pulse (!) 121    Ht 4\' 9"  (1.448 m)    Wt 258 lb (117 kg)    SpO2 97%    BMI 55.83 kg/m   Neurological Exam: MENTAL STATUS including orientation to time, place, person, recent and remote memory, attention span and concentration, language, and fund of knowledge is normal.  Speech is not dysarthric.  CRANIAL NERVES: II:  No visual field defects.   III-IV-VI: Pupils equal round and reactive to light.  Normal conjugate, extra-ocular eye movements in all directions of gaze.  No nystagmus.  No ptosis.   V:  Normal facial sensation.    VII:  Normal facial symmetry and movements.   VIII:  Normal hearing and vestibular function.   IX-X:  Normal palatal movement.   XI:  Normal shoulder shrug and head rotation.   XII:  Normal tongue strength and range of motion, no deviation or fasciculation.  MOTOR:  Motor strength shows give-way weakness throughout. However, with repeated encouragement motor strength is appreciated to be 5/5 distally,including the left foot.   No atrophy, fasciculations or abnormal movements.  No pronator drift.   MSRs:  Right        Left                  brachioradialis 2+  2+  biceps 2+  2+  triceps 2+  2+  patellar 2+  2+  ankle jerk 2+  2+  Hoffman no  no  plantar response down  down   SENSORY:  Normal and symmetric perception of light touch, pinprick, vibration, and proprioception.   COORDINATION/GAIT: Normal finger-to- nose-finger.  Intact rapid alternating movements bilaterally.  Gait is assisted with walker, there is no foot drop or dragging of the left foot.   IMPRESSION: Left foot weakness - resolved. There is no evidence of weakness or muscle atrophy on my exam.  Her exam is nonphysiological with motor strength testing and with repeated encouragement as well as testing the same groups in different ways, strength seems intact. I encouraged her to continue PT.  If her weakness returns, I would recommend  repeating EMG, as her initial study had some technical variation.   Thank you for allowing me to participate in patient's care.  If I can answer any additional questions, I would be pleased to do so.    Sincerely,    Shihab States K. , DO

## 2021-04-25 ENCOUNTER — Emergency Department (HOSPITAL_COMMUNITY)
Admission: EM | Admit: 2021-04-25 | Discharge: 2021-04-25 | Disposition: A | Discharge status: Home / Self Care | Payer: No Typology Code available for payment source | Attending: Emergency Medicine | Admitting: Emergency Medicine

## 2021-04-25 ENCOUNTER — Other Ambulatory Visit: Payer: Self-pay

## 2021-04-25 ENCOUNTER — Encounter (HOSPITAL_COMMUNITY): Payer: Self-pay

## 2021-04-25 DIAGNOSIS — Z20822 Contact with and (suspected) exposure to covid-19: Secondary | ICD-10-CM | POA: Insufficient documentation

## 2021-04-25 DIAGNOSIS — I10 Essential (primary) hypertension: Secondary | ICD-10-CM | POA: Insufficient documentation

## 2021-04-25 DIAGNOSIS — K047 Periapical abscess without sinus: Secondary | ICD-10-CM

## 2021-04-25 DIAGNOSIS — Z79899 Other long term (current) drug therapy: Secondary | ICD-10-CM | POA: Insufficient documentation

## 2021-04-25 DIAGNOSIS — H6121 Impacted cerumen, right ear: Secondary | ICD-10-CM | POA: Insufficient documentation

## 2021-04-25 DIAGNOSIS — K029 Dental caries, unspecified: Secondary | ICD-10-CM | POA: Insufficient documentation

## 2021-04-25 DIAGNOSIS — R0981 Nasal congestion: Secondary | ICD-10-CM | POA: Insufficient documentation

## 2021-04-25 DIAGNOSIS — J069 Acute upper respiratory infection, unspecified: Secondary | ICD-10-CM

## 2021-04-25 HISTORY — DX: Essential (primary) hypertension: I10

## 2021-04-25 MED ORDER — AMOXICILLIN-POT CLAVULANATE 875-125 MG PO TABS: 1.0000 | ORAL_TABLET | Freq: Two times a day (BID) | ORAL | 0 refills | Status: AC

## 2021-04-25 MED ORDER — ACETAMINOPHEN 325 MG PO TABS
650.0000 mg | ORAL_TABLET | Freq: Once | ORAL | Status: AC
  Administered 2021-04-25: 650 mg via ORAL
  Filled 2021-04-25: qty 2

## 2021-04-25 MED ORDER — AMOXICILLIN-POT CLAVULANATE 875-125 MG PO TABS
1.0000 | ORAL_TABLET | Freq: Once | ORAL | Status: AC
  Administered 2021-04-25: 1 via ORAL
  Filled 2021-04-25: qty 1

## 2021-04-25 MED ORDER — IBUPROFEN 200 MG PO TABS
400.0000 mg | ORAL_TABLET | Freq: Once | ORAL | Status: AC
  Administered 2021-04-25: 400 mg via ORAL
  Filled 2021-04-25: qty 2

## 2021-04-25 NOTE — ED Triage Notes (Signed)
Patient is having pressure in her nose, burning into the right side of her face, into her ear, and throat. This started yesterday. Burning to swallow.  ?

## 2021-04-25 NOTE — ED Provider Notes (Signed)
?[facility]-EMERGENCY DEPT ?Provider Note ? ? ?CSN: [id number] ?Arrival date & time: 04/25/21  2126 ? ?  ? ?History ? ?Chief Complaint  ?Patient presents with  ? Facial Pain  ? ? ?Gloria Fletcher is a 46 y.o. female who presents with 24 hours with right-sided facial pressure, pressure in her ear, sore throat, and soreness in the right lower gums.  Was recently exposed to ill persons and had single episode of watery diarrhea. ? ?I personally reviewed this patient's medical records.  History of hypertension, not on any medications daily aside from gabapentin. ? ?HPI ? ?  ? ?Home Medications ?Prior to Admission medications   ?Medication Sig Start Date End Date Taking? Authorizing Provider  ?diphenhydrAMINE (BENADRYL) 25 MG tablet Take 25 mg by mouth every 6 (six) hours as needed. ?Patient not taking: Reported on 02/06/2021    [provider]  ?gabapentin (NEURONTIN) 300 MG capsule Take 3-4 during the day and 2 at bedtime. 12/04/20   [provider]  ?ibuprofen (ADVIL) 800 MG tablet Take 1 tablet (800 mg total) by mouth every 8 (eight) hours as needed for moderate pain. 07/30/19   Moshe Cipro, NP  ?lidocaine (LIDODERM) 5 % Place 1 patch onto the skin daily. Remove & Discard patch within 12 hours or as directed by MD    [provider]  ?methocarbamol (ROBAXIN) 500 MG tablet Take 1 tablet (500 mg total) by mouth 2 (two) times daily. ?Patient not taking: Reported on 02/06/2021 05/23/20   Jaleiyah Alas, Eugene Gavia, PA-C  ?naproxen sodium (ALEVE) 220 MG tablet Take 220 mg by mouth. ?Patient not taking: Reported on 02/06/2021    [provider]  ?predniSONE (DELTASONE) 20 MG tablet Take 2 tablets daily with breakfast. ?Patient not taking: Reported on 02/06/2021 03/27/18   Wallis Bamberg, PA-C  ?   ? ?Allergies    ?Patient has no known allergies.   ? ?Review of Systems   ?Review of Systems  ?Constitutional: Negative.   ?HENT:  Positive for congestion, dental  problem, ear pain, sinus pressure, sinus pain and trouble swallowing. Negative for rhinorrhea and sneezing.   ?Respiratory: Negative.    ?Cardiovascular: Negative.   ?Gastrointestinal: Negative.   ?Genitourinary: Negative.   ?Musculoskeletal: Negative.   ?Skin: Negative.   ?Neurological: Negative.   ? ?Physical Exam ?Updated Vital Signs ?BP (!) 175/104   Pulse 84   Temp 98.3 ?F (36.8 ?C) (Oral)   Resp 16   Ht 4\' 9"  (1.448 m)   Wt 99.8 kg   SpO2 95%   BMI 47.61 kg/m?  ?Physical Exam ?Vitals and nursing note reviewed.  ?Constitutional:   ?   Appearance: She is obese. She is not ill-appearing or toxic-appearing.  ?HENT:  ?   Head: Normocephalic and atraumatic.  ?   Right Ear: There is impacted cerumen.  ?   Left Ear: Tympanic membrane normal.  ?   Nose: Congestion present.  ?   Mouth/Throat:  ?   Mouth: Mucous membranes are moist.  ?   Dentition: Abnormal dentition. Gingival swelling and dental caries present.  ?   Pharynx: Oropharynx is clear. Uvula midline. No oropharyngeal exudate or posterior oropharyngeal erythema.  ?   Tonsils: No tonsillar exudate.  ? ?   Comments: No sublingual or submental tenderness palpation. ?Eyes:  ?   General:     ?   Right eye: No discharge.     ?   Left eye: No discharge.  ?   Extraocular Movements:  Extraocular movements intact.  ?   Conjunctiva/sclera: Conjunctivae normal.  ?   Pupils: Pupils are equal, round, and reactive to light.  ?Neck:  ?   Trachea: Trachea and phonation normal.  ?   Meningeal: Brudzinski's sign and Kernig's sign absent.  ?Cardiovascular:  ?   Rate and Rhythm: Normal rate and regular rhythm.  ?   Pulses: Normal pulses.  ?   Heart sounds: Normal heart sounds. No murmur heard. ?Pulmonary:  ?   Effort: Pulmonary effort is normal. No respiratory distress.  ?   Breath sounds: Normal breath sounds. No wheezing or rales.  ?Abdominal:  ?   General: Bowel sounds are normal. There is no distension.  ?   Palpations: Abdomen is soft.  ?   Tenderness: There is no  abdominal tenderness. There is no right CVA tenderness, left CVA tenderness, guarding or rebound.  ?Musculoskeletal:     ?   General: No deformity.  ?   Cervical back: Normal range of motion and neck supple. No crepitus.  ?Lymphadenopathy:  ?   Cervical: Cervical adenopathy present.  ?   Right cervical: Superficial cervical adenopathy present.  ?   Left cervical: No superficial cervical adenopathy.  ?Skin: ?   General: Skin is warm and dry.  ?   Capillary Refill: Capillary refill takes less than 2 seconds.  ?Neurological:  ?   General: No focal deficit present.  ?   Mental Status: She is alert and oriented to person, place, and time. Mental status is at baseline.  ?Psychiatric:     ?   Mood and Affect: Mood normal.  ? ? ?ED Results / Procedures / Treatments   ?Labs ?(all labs ordered are listed, but only abnormal results are displayed) ?Labs Reviewed - No data to display ? ?EKG ?None ? ?Radiology ?No results found. ? ?Procedures ?Procedures  ? ? ?Medications Ordered in ED ?Medications - No data to display ? ?ED Course/ Medical Decision Making/ A&P ?  ?                        ?Medical Decision Making ?46 year old female presents with concern for right-sided facial pain and ear pain for the last 24 hours. ? ?Hypertensive intake, vital signs otherwise normal.  Cardiopulmonary and abdominal exams are benign.  Patient with right maxillary sinus tenderness to palpation, right mandibular periapical infection without abscess, right-sided cerumen impaction, right-sided shotty superior cervical lymphadenopathy. ? ?Amount and/or Complexity of Data Reviewed ?Labs: ordered. ?   Details: RVP pending ? ?Risk ?OTC drugs. ?Prescription drug management. ? ? ?Suspect concomitant viral URI and dental infection.  RVP pending.  Will discharge with course of Augmentin for dental infection recommend outpatient dental follow-up. ? ?Clinical concern for other emergent underlying etiology would warrant further ED work-up or inpatient  management is exceedingly low. [name] voiced understanding of her medical evaluation and treatment plan.  Her questions answered to her expressed satisfaction.  ? ?Return precautions given.  Patient is well-appearing, stable, and was discharged in good condition. ? ?This chart was dictated using voice recognition software, Dragon. Despite the best efforts of this provider to proofread and correct errors, errors may still occur which can change documentation meaning. ? ?Final Clinical Impression(s) / ED Diagnoses ?Final diagnoses:  ?None  ? ? ?Rx / DC Orders ?ED Discharge Orders   ? ? None  ? ?  ? ? ?  ?Paris Lore, PA-C ?04/26/21 0011 ? ?  ?Tegeler, Canary Brim,  MD ?04/26/21 1506 ? ?

## 2021-04-25 NOTE — Discharge Instructions (Signed)
You are seen in the ER today for your facial pain and congestion.  You likely have a viral upper respiratory infection.  You been tested for COVID-19 and influenza.  You may follow the test results in your MyChart account. ? ?Additionally you are found to have a dental infection.  You been prescribed an antibiotic to take twice a day for the next week to treat this.  Please take it as prescribed for the entire course.  Please increase your hydration and return to the ER with any new severe symptoms. ?

## 2021-04-26 LAB — RESP PANEL BY RT-PCR (FLU A&B, COVID) ARPGX2
Influenza A by PCR: NEGATIVE
Influenza B by PCR: NEGATIVE
SARS Coronavirus 2 by RT PCR: NEGATIVE

## 2023-05-12 IMAGING — XA Imaging study
1 series · 1 of 1 positions shown · non-contrast
Comparison: none

CLINICAL DATA: Lumbar radiculopathy.

[Series 1: ortho adipose · 1 of 1 slices shown]
[im 1/1]
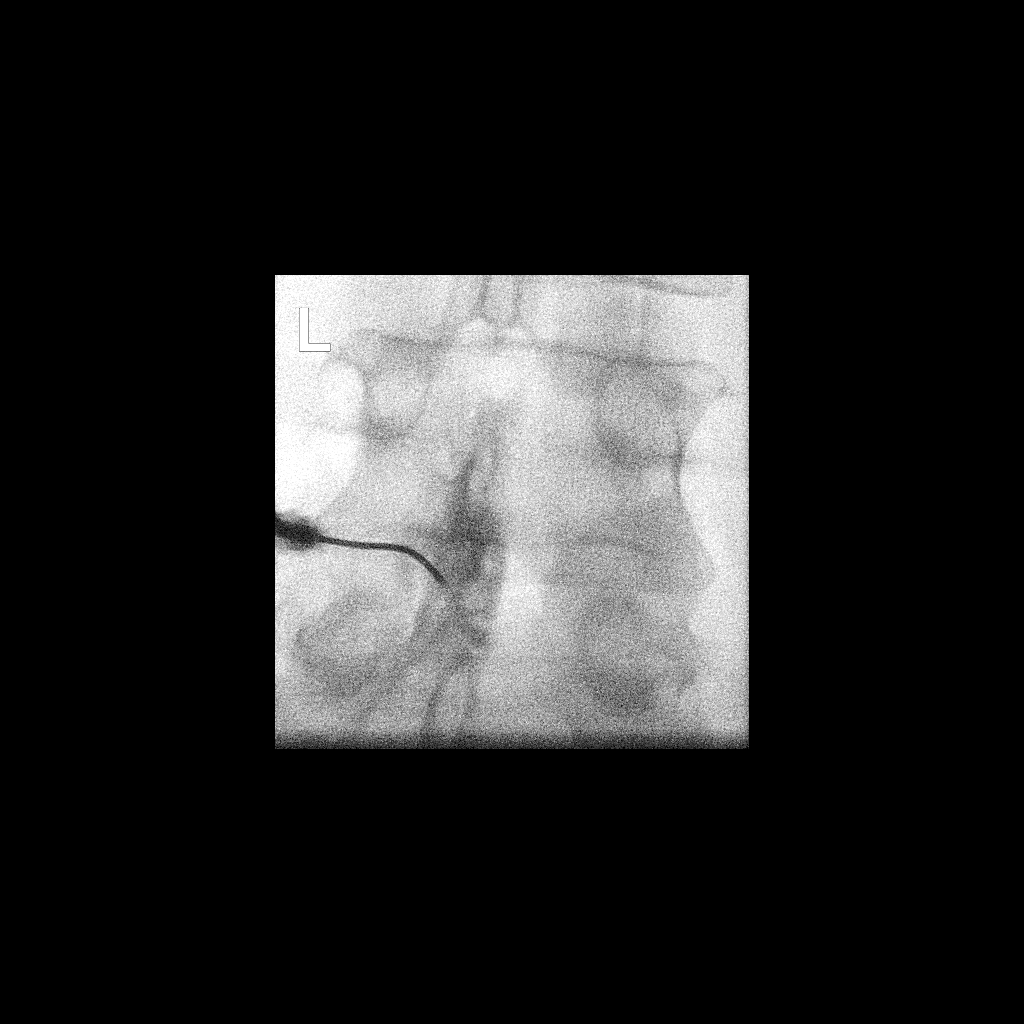

[1 of 1 positions shown; findings below may reference images not displayed]

EXAM:
LUMBAR EPIDURAL INJECTION:

DIAGNOSTIC EPIDURAL INJECTION:

THERAPEUTIC EPIDURAL INJECTION:

PROCEDURE:
The procedure, risks, benefits, and alternatives were explained to
the patient. Questions regarding the procedure were encouraged and
answered. The patient understands and consents to the procedure.

An interlaminar approach was performed on the left at L4-5. The
overlying skin was cleansed and anesthetized. A 20 gauge epidural
needle was advanced using loss-of-resistance technique.

Injection of Isovue-M 200 shows a good epidural pattern with spread
above and below the level of needle placement, primarily on the
left, with some crossing midline to the right. No vascular
opacification is seen.

80mg of Depo-Medrol mixed with 2ml lidocaine 1% were instilled. The
procedure was well-tolerated, and the patient was discharged thirty
minutes following the injection in good condition.

FLUOROSCOPY TIME:  37 seconds; 47 u0ym1 DAP

COMPLICATIONS:
None immediate
IMPRESSION: Technically successful epidural injection on the   left at L4-5.

## 2023-07-07 ENCOUNTER — Other Ambulatory Visit: Payer: Self-pay | Admitting: Family

## 2023-07-07 ENCOUNTER — Ambulatory Visit
Admission: RE | Admit: 2023-07-07 | Discharge: 2023-07-07 | Disposition: A | Discharge status: Home / Self Care | Source: Ambulatory Visit | Attending: Family | Admitting: Family

## 2023-07-07 DIAGNOSIS — M25562 Pain in left knee: Secondary | ICD-10-CM
